# Patient Record
Sex: Male | Born: 1960 | Race: White | Hispanic: No | Marital: Married | Smoking: Current every day smoker
Health system: Southern US, Community
[De-identification: ages and names within clinical notes are randomized; demographics above are authoritative.]

## PROBLEM LIST (undated history)

## (undated) DIAGNOSIS — F419 Anxiety disorder, unspecified: Secondary | ICD-10-CM

## (undated) DIAGNOSIS — M545 Low back pain, unspecified: Secondary | ICD-10-CM

## (undated) DIAGNOSIS — L409 Psoriasis, unspecified: Secondary | ICD-10-CM

## (undated) DIAGNOSIS — K625 Hemorrhage of anus and rectum: Secondary | ICD-10-CM

## (undated) DIAGNOSIS — K649 Unspecified hemorrhoids: Secondary | ICD-10-CM

## (undated) HISTORY — DX: Low back pain, unspecified: M54.50

## (undated) HISTORY — DX: Low back pain: M54.5

## (undated) HISTORY — DX: Unspecified hemorrhoids: K64.9

## (undated) HISTORY — DX: Hemorrhage of anus and rectum: K62.5

## (undated) HISTORY — DX: Anxiety disorder, unspecified: F41.9

---

## 1994-03-26 HISTORY — PX: VASECTOMY: SHX75

## 2003-03-27 HISTORY — PX: KNEE ARTHROSCOPY: SUR90

## 2004-11-24 ENCOUNTER — Ambulatory Visit: Payer: Self-pay | Admitting: Orthopedic Surgery

## 2004-11-30 ENCOUNTER — Ambulatory Visit: Payer: Self-pay | Admitting: Orthopedic Surgery

## 2006-03-26 HISTORY — PX: KNEE ARTHROSCOPY: SUR90

## 2010-08-27 ENCOUNTER — Ambulatory Visit: Payer: Self-pay | Admitting: Family Medicine

## 2011-07-14 ENCOUNTER — Ambulatory Visit: Payer: Self-pay | Admitting: Physical Medicine and Rehabilitation

## 2011-11-23 DIAGNOSIS — R079 Chest pain, unspecified: Secondary | ICD-10-CM | POA: Insufficient documentation

## 2011-11-23 DIAGNOSIS — K649 Unspecified hemorrhoids: Secondary | ICD-10-CM | POA: Insufficient documentation

## 2011-11-23 DIAGNOSIS — K625 Hemorrhage of anus and rectum: Secondary | ICD-10-CM | POA: Insufficient documentation

## 2012-04-13 ENCOUNTER — Ambulatory Visit: Payer: Self-pay | Admitting: Internal Medicine

## 2014-01-06 IMAGING — CT CT ABD-PELV W/ CM
1 of 2 series · 15 of 32 positions shown, 19 images · IV contrast (isovue)
Comparison: none

REASON FOR EXAM: RLQ pain
COMMENTS:

PROCEDURE:     CT  - CT ABDOMEN / PELVIS  W  - April 13, 2012  [DATE]
RESULT:     Comparison:  None
TECHNIQUE: Multiple axial images of the abdomen and pelvis were performed
from the lung bases to the pubic symphysis, with p.o. contrast and with 100
mL of Isovue 300 intravenous contrast.

[Series 2: 3mm soft tissue · axial · 0.68mm/px · z∈[-1001,-584]mm · 15 of 153 slices shown, 19 images]
[im 7/153  soft-tissue]
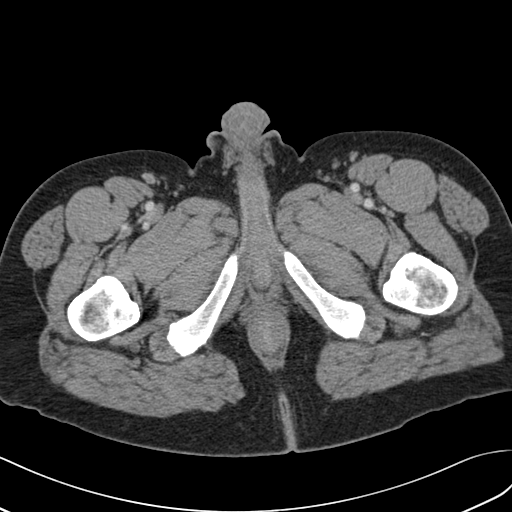
[im 7/153  bone]
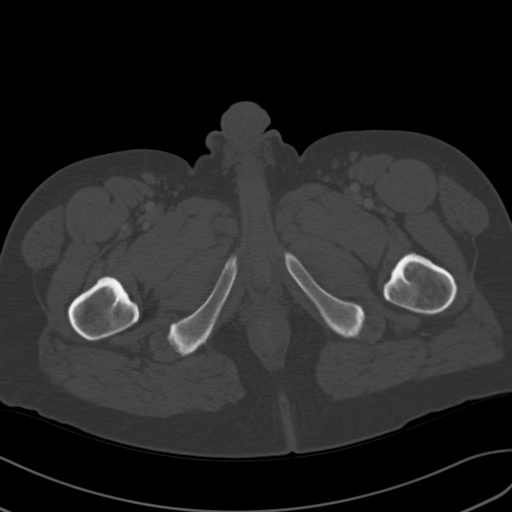
[im 20/153  soft-tissue]
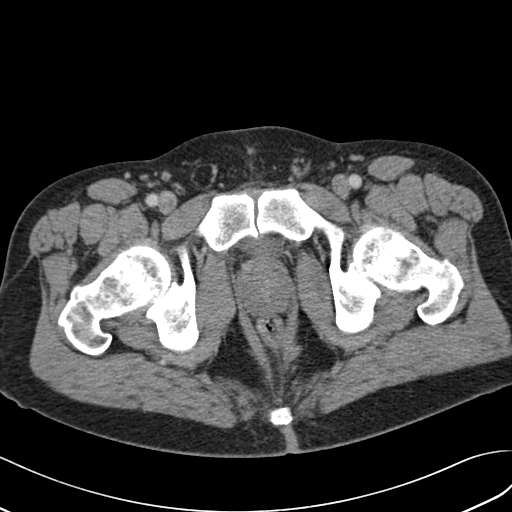
[im 32/153  soft-tissue]
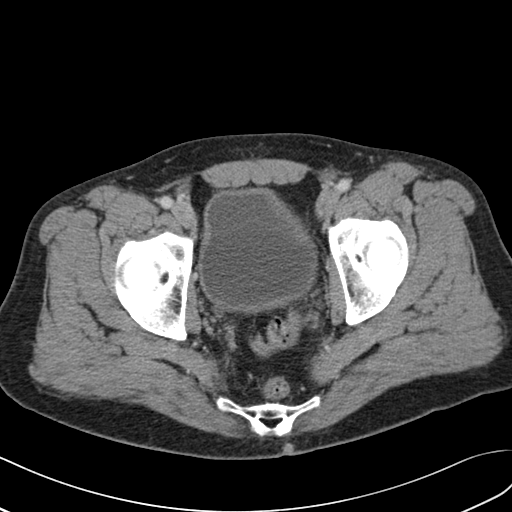
[im 45/153  soft-tissue]
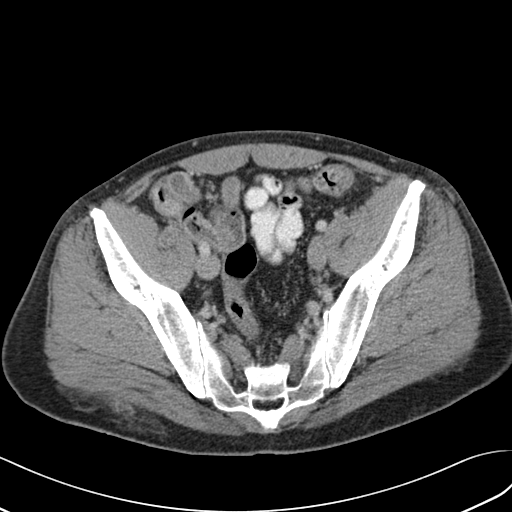
[im 51/153  soft-tissue]
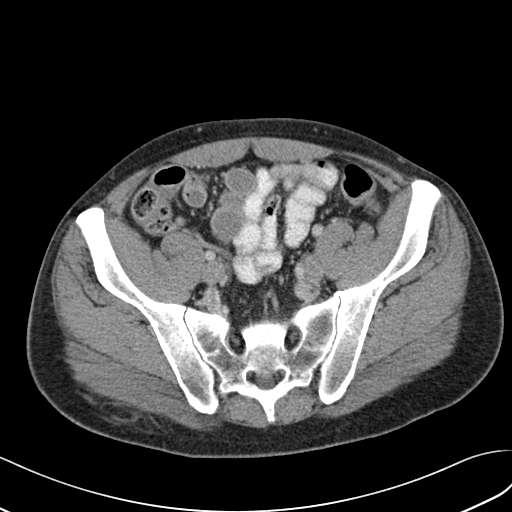
[im 64/153  soft-tissue]
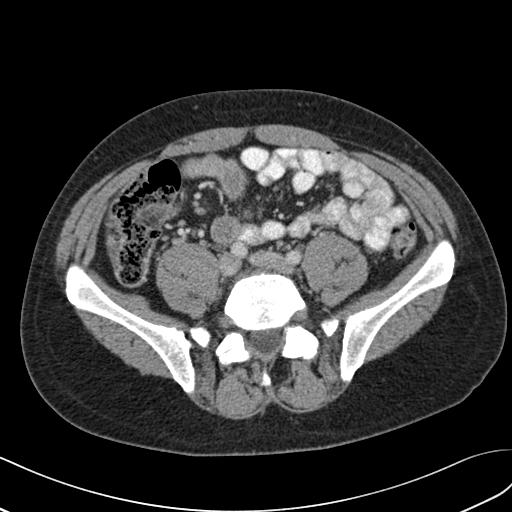
[im 77/153  soft-tissue]
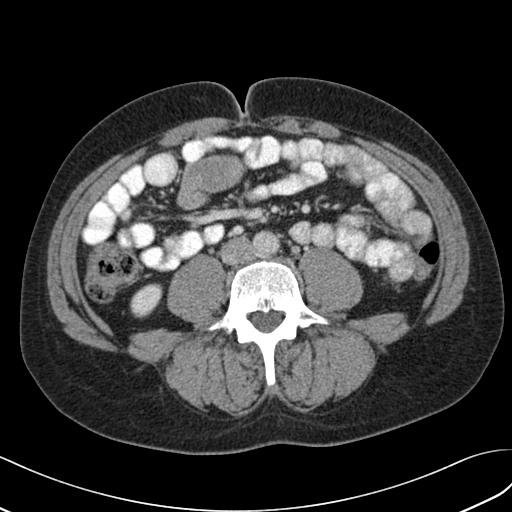
[im 89/153  soft-tissue]
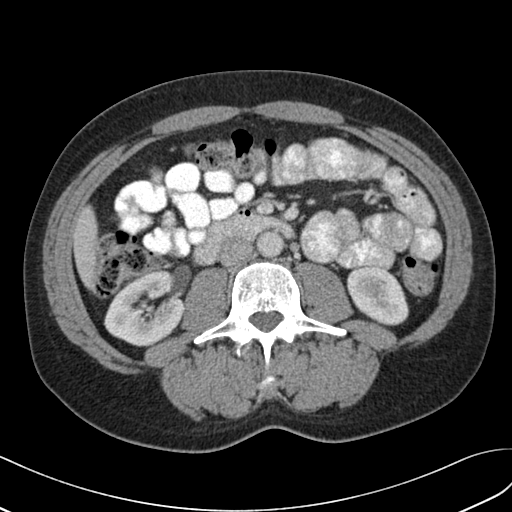
[im 102/153  soft-tissue]
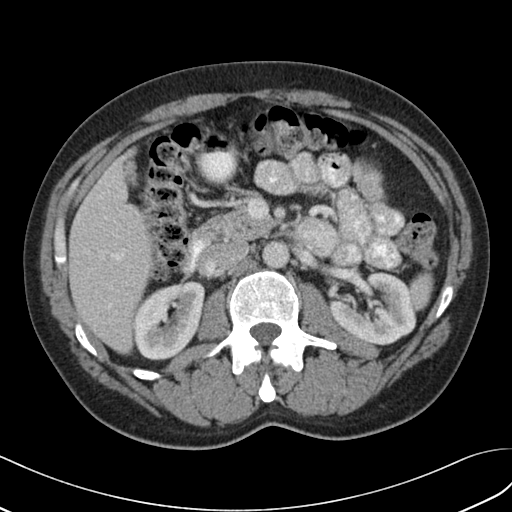
[im 102/153  bone]
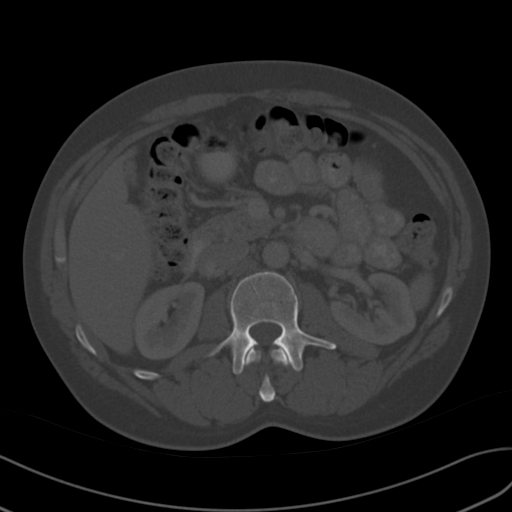
[im 108/153  soft-tissue]
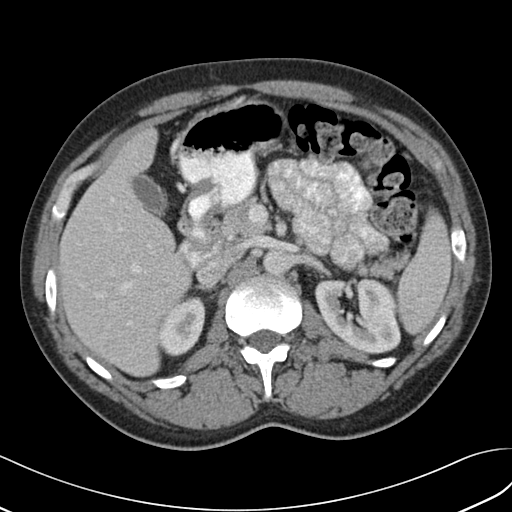
[im 121/153  soft-tissue]
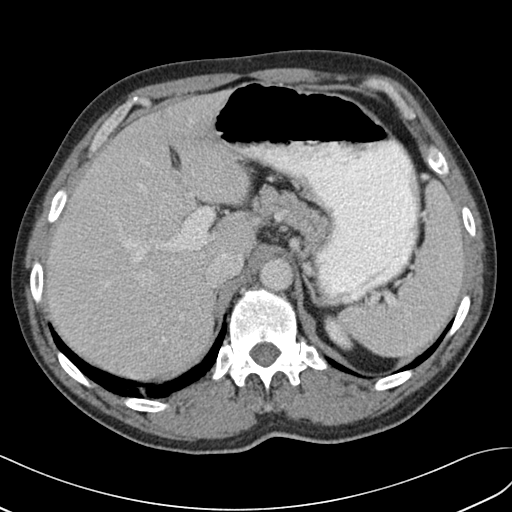
[im 127/153  lung]
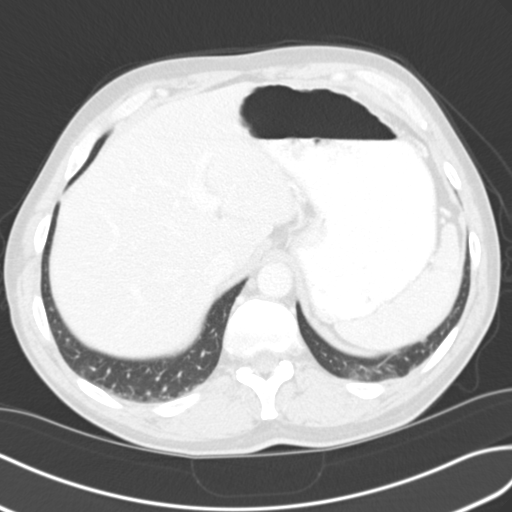
[im 134/153  soft-tissue]
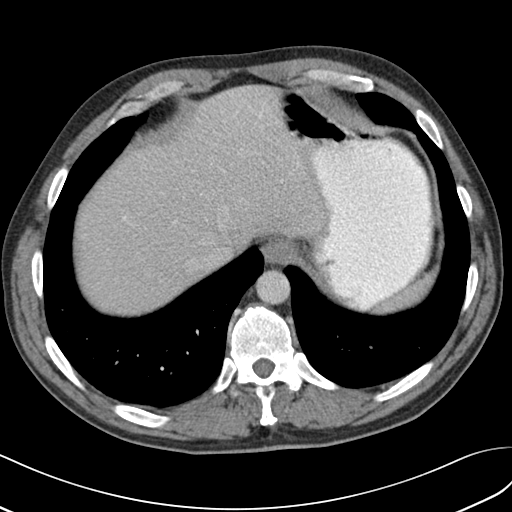
[im 134/153  lung]
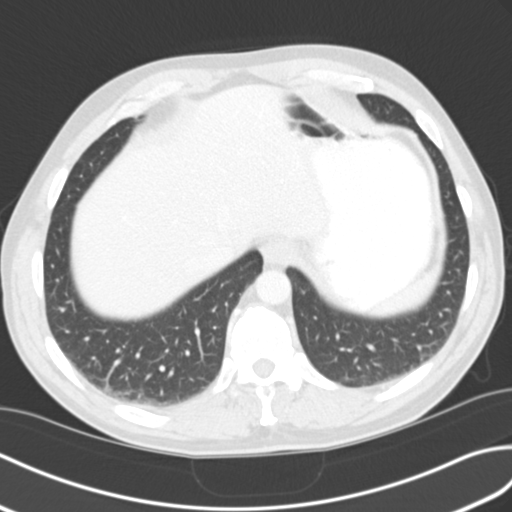
[im 140/153  lung]
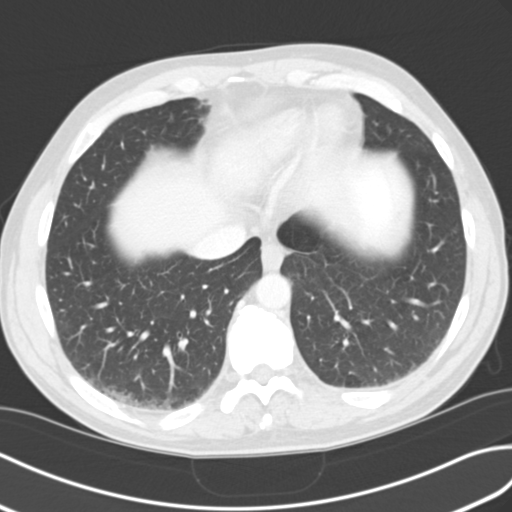
[im 146/153  soft-tissue]
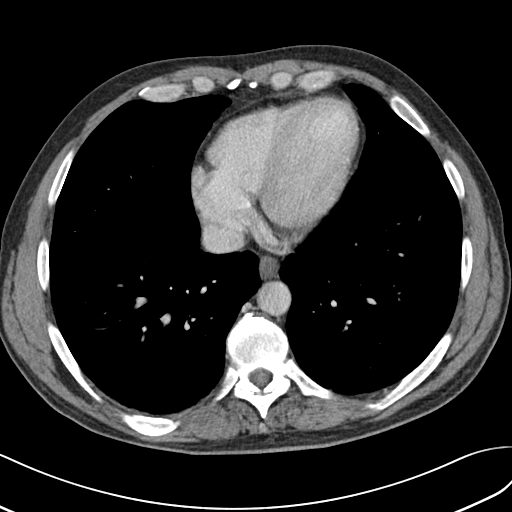
[im 146/153  lung]
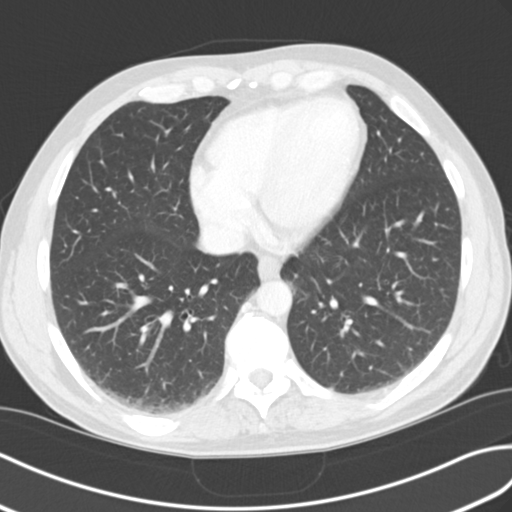

[15 of 32 positions shown; findings below may reference images not displayed]

FINDINGS: Mild basilar opacities are likely secondary to atelectasis.

The liver, gallbladder, spleen, adrenals, and pancreas are unremarkable. The
kidneys enhance normally.

The small and large bowel are normal in caliber. The appendix is normal.

There is mild stranding and a few small foci of air in the subcutaneous fat
of the buttock. Correlate for recent prior injection.

No aggressive lytic or sclerotic osseous lesions are identified.
IMPRESSION: 1. No acute findings in the abdomen or pelvis. Normal appendix.
2. Mild stranding and small foci of air in the subcutaneous fat of the right
buttock. Correlate for recent prior injection.

The report was called to the ordering clinician immediately after the
dictation.

[REDACTED]

## 2014-08-09 ENCOUNTER — Ambulatory Visit
Admission: EM | Admit: 2014-08-09 | Discharge: 2014-08-09 | Disposition: A | Discharge status: Home / Self Care | Payer: No Typology Code available for payment source | Attending: Family Medicine | Admitting: Family Medicine

## 2014-08-09 DIAGNOSIS — H6123 Impacted cerumen, bilateral: Secondary | ICD-10-CM | POA: Diagnosis not present

## 2014-08-09 HISTORY — DX: Psoriasis, unspecified: L40.9

## 2014-08-09 MED ORDER — CARBAMIDE PEROXIDE 6.5 % OT SOLN: OTIC | Status: DC

## 2014-08-09 NOTE — ED Provider Notes (Signed)
CSN: [id number]     Arrival date & time 08/09/14  1129 History   First MD Initiated Contact with Patient 08/09/14 1206     Chief Complaint  Patient presents with  . Otalgia   (Consider location/radiation/quality/duration/timing/severity/associated sxs/prior Treatment) Patient is a 54 y.o. male presenting with ear pain. The history is provided by the patient. No language interpreter was used.  Otalgia Location:  Right Quality:  Pressure Severity:  Moderate Onset quality:  Gradual Duration:  1 week Timing:  Constant Progression:  Worsening Chronicity:  Recurrent Context: not direct blow, not elevation change, not foreign body in ear, not loud noise and no water in ear   Relieved by:  Nothing Associated symptoms: hearing loss   Associated symptoms: no abdominal pain, no congestion, no diarrhea, no ear discharge, no fever, no headaches, no rash, no rhinorrhea, no sore throat and no tinnitus   Risk factors: no recent travel, no chronic ear infection and no prior ear surgery     Past Medical History  Diagnosis Date  . Psoriasis    History reviewed. No pertinent past surgical history. Family History  Problem Relation Age of Onset  . Heart failure Mother   . Cancer Father    History  Substance Use Topics  . Smoking status: Current Every Day Smoker -- 2.00 packs/day    Types: Cigarettes  . Smokeless tobacco: Not on file  . Alcohol Use: Yes     Comment: rarely    Review of Systems  Constitutional: Negative for fever.  HENT: Positive for ear pain and hearing loss. Negative for congestion, ear discharge, rhinorrhea, sore throat and tinnitus.        While he is here because of pain in the right ear he has a history of having wax buildup in both ears.  Gastrointestinal: Negative for abdominal pain and diarrhea.  Skin: Negative for rash.  Neurological: Negative for headaches.    Allergies  Review of patient's allergies indicates no known allergies.  Home Medications   Prior  to Admission medications   Medication Sig Start Date End Date Taking? Authorizing Provider  carbamide peroxide (DEBROX) 6.5 % otic solution Recommend he place a Doppler to both ears twice a day until return for repeat irrigation. 08/09/14   Hassan RowanEugene Divon Krabill, MD   BP 126/77 mmHg  Pulse 72  Temp(Src) 97 F (36.1 C) (Tympanic)  Resp 16  Ht 5\' 11"  (1.803 m)  Wt 200 lb (90.719 kg)  BMI 27.91 kg/m2  SpO2 99% Physical Exam  Constitutional: He appears well-developed and well-nourished.  HENT:  Head: Normocephalic and atraumatic.  Right Ear: Decreased hearing is noted.  Left Ear: Decreased hearing is noted.  Ears:  Musculoskeletal: Normal range of motion.  Neurological: He is alert.  Skin: Skin is warm and dry.  Vitals reviewed.   ED Course  EAR CERUMEN REMOVAL Date/Time: 08/09/2014 12:25 PM Performed by: Hassan RowanWADE, Hamdan Toscano Authorized by: Hassan RowanWADE, Deontra Pereyra Consent: Verbal consent obtained. Patient understanding: patient states understanding of the procedure being performed Patient consent: the patient's understanding of the procedure matches consent given Local anesthetic: none Location: Both ears had excess amount of wax present. Patient tolerance: Patient tolerated the procedure well with no immediate complications (Attempts were made initially with curette unsuccessfully to remove the wax plans are to irrigate both ears now)   (including critical care time) Labs Review Labs Reviewed - No data to display  Imaging Review No results found. Patient left ear still had significant amount cerumen left right ear was  almost completely clear but the ear canal was markedly hyperemic at this time. No further irrigations this time I have patient return in a week if he so desire after placing Debrox in both ears.  MDM   1. Cerumen impaction, bilateral        Hassan RowanEugene Kaizley Aja, MD 08/09/14 2053

## 2014-08-09 NOTE — Discharge Instructions (Signed)
Cerumen Impaction °A cerumen impaction is when the wax in your ear forms a plug. This plug usually causes reduced hearing. Sometimes it also causes an earache or dizziness. Removing a cerumen impaction can be difficult and painful. The wax sticks to the ear canal. The canal is sensitive and bleeds easily. If you try to remove a heavy wax buildup with a cotton tipped swab, you may push it in further. °Irrigation with water, suction, and small ear curettes may be used to clear out the wax. If the impaction is fixed to the skin in the ear canal, ear drops may be needed for a few days to loosen the wax. People who build up a lot of wax frequently can use ear wax removal products available in your local drugstore. °SEEK MEDICAL CARE IF:  °You develop an earache, increased hearing loss, or marked dizziness. °Document Released: 04/19/2004 Document Revised: 06/04/2011 Document Reviewed: 06/09/2009 °ExitCare® Patient Information ©2015 [employer]. This information is not intended to replace advice given to you by your health care provider. Make sure you discuss any questions you have with your health care provider. ° °Ear Drops °You have been diagnosed with a condition requiring you to put drops of medicine into your outer ear. °HOME CARE INSTRUCTIONS  °· Put drops in the affected ear as instructed. After putting the drops in, you will need to lie down with the affected ear facing up for ten minutes so the drops will remain in the ear canal and run down and fill the canal. Continue using the ear drops for as long as directed by your health care provider. °· Prior to getting up, put a cotton ball gently in your ear canal. Leave enough of the cotton ball out so it can be easily removed. Do not attempt to push this down into the canal with a cotton-tipped swab or other instrument. °· Do not irrigate or wash out your ears if you have had a perforated eardrum or mastoid surgery, or unless instructed to do so by your health care  provider. °· Keep appointments with your health care provider as instructed. °· Finish all medicine, or use for the length of time prescribed by your health care provider. Continue the drops even if your problem seems to be doing well after a couple days, or continue as instructed. °SEEK MEDICAL CARE IF: °· You become worse or develop increasing pain. °· You notice any unusual drainage from your ear (particularly if the drainage has a bad smell). °· You develop hearing difficulties. °· You experience a serious form of dizziness in which you feel as if the room is spinning, and you feel nauseated (vertigo). °· The outside of your ear becomes red or swollen or both. This may be a sign of an allergic reaction. °MAKE SURE YOU:  °· Understand these instructions. °· Will watch your condition. °· Will get help right away if you are not doing well or get worse. °Document Released: 03/06/2001 Document Revised: 03/17/2013 Document Reviewed: 10/07/2012 °ExitCare® Patient Information ©2015 [employer]. This information is not intended to replace advice given to you by your health care provider. Make sure you discuss any questions you have with your health care provider. ° °

## 2014-08-09 NOTE — ED Notes (Signed)
States noted to have ear wax impacted right ear. States flushed ear with large amount of wax. Now "very sore"

## 2014-08-13 ENCOUNTER — Ambulatory Visit
Admission: EM | Admit: 2014-08-13 | Discharge: 2014-08-13 | Disposition: A | Discharge status: Home / Self Care | Payer: No Typology Code available for payment source | Attending: Registered Nurse | Admitting: Registered Nurse

## 2014-08-13 DIAGNOSIS — L03116 Cellulitis of left lower limb: Secondary | ICD-10-CM

## 2014-08-13 MED ORDER — SULFAMETHOXAZOLE-TRIMETHOPRIM 800-160 MG PO TABS: 1.0000 | ORAL_TABLET | Freq: Two times a day (BID) | ORAL | Status: AC

## 2014-08-13 NOTE — ED Notes (Signed)
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 Wearing long pants and did not see what bit or stung. 4-5 hours later noted black area/bite and squeezed a small amount of pus. Now very sore and read with slight bullseye appearance of the area just above left knee

## 2014-08-13 NOTE — Discharge Instructions (Signed)
Insect Bite Mosquitoes, flies, fleas, bedbugs, and many other insects can bite. Insect bites are different from insect stings. A sting is when venom is injected into the skin. Some insect bites can transmit infectious diseases. SYMPTOMS  Insect bites usually turn red, swell, and itch for 2 to 4 days. They often go away on their own. TREATMENT  Your caregiver may prescribe antibiotic medicines if a bacterial infection develops in the bite. HOME CARE INSTRUCTIONS  Do not scratch the bite area.  Keep the bite area clean and dry. Wash the bite area thoroughly with soap and water.  Put ice or cool compresses on the bite area.  Put ice in a plastic bag.  Place a towel between your skin and the bag.  Leave the ice on for 20 minutes, 4 times a day for the first 2 to 3 days, or as directed.  You may apply a baking soda paste, cortisone cream, or calamine lotion to the bite area as directed by your caregiver. This can help reduce itching and swelling.  Only take over-the-counter or prescription medicines as directed by your caregiver.  If you are given antibiotics, take them as directed. Finish them even if you start to feel better. You may need a tetanus shot if:  You cannot remember when you had your last tetanus shot.  You have never had a tetanus shot.  The injury broke your skin. If you get a tetanus shot, your arm may swell, get red, and feel warm to the touch. This is common and not a problem. If you need a tetanus shot and you choose not to have one, there is a rare chance of getting tetanus. Sickness from tetanus can be serious. SEEK IMMEDIATE MEDICAL CARE IF:   You have increased pain, redness, or swelling in the bite area.  You see a red line on the skin coming from the bite.  You have a fever.  You have joint pain.  You have a headache or neck pain.  You have unusual weakness.  You have a rash.  You have chest pain or shortness of breath.  You have abdominal pain,  nausea, or vomiting.  You feel unusually tired or sleepy. MAKE SURE YOU:   Understand these instructions.  Will watch your condition.  Will get help right away if you are not doing well or get worse. Document Released: 04/19/2004 Document Revised: 06/04/2011 Document Reviewed: 10/11/2010 ExitCare Patient Information 2015 [employer]. This information is not intended to replace advice given to you by your health care provider. Make sure you discuss any questions you have with your health care provider. Cellulitis Cellulitis is an infection of the skin and the tissue beneath it. The infected area is usually red and tender. Cellulitis occurs most often in the arms and lower legs.  CAUSES  Cellulitis is caused by bacteria that enter the skin through cracks or cuts in the skin. The most common types of bacteria that cause cellulitis are staphylococci and streptococci. SIGNS AND SYMPTOMS   Redness and warmth.  Swelling.  Tenderness or pain.  Fever. DIAGNOSIS  Your health care provider can usually determine what is wrong based on a physical exam. Blood tests may also be done. TREATMENT  Treatment usually involves taking an antibiotic medicine. HOME CARE INSTRUCTIONS   Take your antibiotic medicine as directed by your health care provider. Finish the antibiotic even if you start to feel better.  Keep the infected arm or leg elevated to reduce swelling.  Apply   a warm cloth to the affected area up to 4 times per day to relieve pain.  Take medicines only as directed by your health care provider.  Keep all follow-up visits as directed by your health care provider. SEEK MEDICAL CARE IF:   You notice red streaks coming from the infected area.  Your red area gets larger or turns dark in color.  Your bone or joint underneath the infected area becomes painful after the skin has healed.  Your infection returns in the same area or another area.  You notice a swollen bump in the  infected area.  You develop new symptoms.  You have a fever. SEEK IMMEDIATE MEDICAL CARE IF:   You feel very sleepy.  You develop vomiting or diarrhea.  You have a general ill feeling (malaise) with muscle aches and pains. MAKE SURE YOU:   Understand these instructions.  Will watch your condition.  Will get help right away if you are not doing well or get worse. Document Released: 12/20/2004 Document Revised: 07/27/2013 Document Reviewed: 05/28/2011 ExitCare Patient Information 2015 [employer]. This information is not intended to replace advice given to you by your health care provider. Make sure you discuss any questions you have with your health care provider.  

## 2014-08-13 NOTE — ED Provider Notes (Signed)
CSN: [id number]     Arrival date & time 08/13/14  [id number] History   None    Chief Complaint  Patient presents with  . 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    Past Medical History  Diagnosis Date  . Psoriasis    History reviewed. No pertinent past surgical history. Family History  Problem Relation Age of Onset  . Heart failure Mother   . Cancer Father    History  Substance Use Topics  . Smoking status: Current Every Day Smoker -- 2.00 packs/day    Types: Cigarettes  . Smokeless tobacco: Not on file  . Alcohol Use: Yes     Comment: rarely    Review of Systems  Constitutional: Negative for fever and chills.  HENT: Negative for ear discharge and ear pain.   Eyes: Negative for pain, discharge and itching.  Respiratory: Negative for cough, shortness of breath and wheezing.   Cardiovascular: Positive for leg swelling. Negative for chest pain and palpitations.  Gastrointestinal: Negative for nausea, vomiting, diarrhea and constipation.  Genitourinary: Negative for difficulty urinating.  Musculoskeletal: Positive for myalgias and joint swelling. Negative for back pain, arthralgias, gait problem, neck pain and neck stiffness.  Skin: Positive for rash. Negative for color change, pallor and wound.  Allergic/Immunologic: Negative for environmental allergies and food allergies.  Neurological: Negative for dizziness, light-headedness and headaches.  Hematological: Negative for adenopathy. Does not bruise/bleed easily.  Psychiatric/Behavioral: Positive for sleep disturbance. Negative for behavioral problems and agitation.    Allergies  Review of patient's allergies indicates no known  allergies.  Home Medications   Prior to Admission medications   Medication Sig Start Date End Date Taking? Authorizing Provider  carbamide peroxide (DEBROX) 6.5 % otic solution Recommend he place a Doppler to both ears twice a day until return for repeat irrigation. 08/09/14   Hassan RowanEugene Wade, MD  sulfamethoxazole-trimethoprim (BACTRIM DS,SEPTRA DS) 800-160 MG per tablet Take 1 tablet by mouth 2 (two) times daily. 08/13/14 08/20/14  Jarold Songina A Betancourt, NP   BP 129/78 mmHg  Pulse 78  Temp(Src) 97.1 F (36.2 C) (Tympanic)  Resp 16  Ht 5\' 11"  (1.803 m)  Wt 200 lb (90.719 kg)  BMI 27.91 kg/m2  SpO2 98% Physical Exam  Constitutional: He is oriented to person, place, and time. Vital signs are normal. He appears well-developed and well-nourished. No distress.  HENT:  Head: Normocephalic and atraumatic.  Right Ear: External ear normal.  Left Ear: External ear normal.  Nose: Nose normal.  Mouth/Throat: Oropharynx is clear and moist. No oropharyngeal exudate.  Eyes: Conjunctivae, EOM and lids are normal. Pupils are equal, round, and reactive to light. Right eye exhibits no discharge. Left eye exhibits no discharge. No scleral icterus.  Neck: Trachea normal and normal range of motion. Neck supple. No tracheal deviation present.  Cardiovascular: Normal rate, regular rhythm and intact distal pulses.   Pulmonary/Chest: Effort normal and breath sounds normal. No respiratory distress. He has no wheezes.  Musculoskeletal: Normal range of motion. He exhibits edema and tenderness.       Left knee: He exhibits swelling and erythema. He exhibits normal range of motion, no effusion, no ecchymosis, no deformity, no laceration, normal alignment, no LCL laxity, normal patellar mobility, no bony tenderness and normal meniscus. Tenderness found. No  medial joint line and no lateral joint line tenderness noted.  Lymphadenopathy:    He has no cervical adenopathy.  Neurological: He is alert and oriented to person, place,  and time.  Skin: Skin is warm, dry and intact. Rash noted. No abrasion, no bruising, no burn, no ecchymosis, no laceration, no lesion, no petechiae and no purpura noted. Rash is macular. Rash is not papular, not maculopapular, not nodular, not pustular, not vesicular and not urticarial. He is not diaphoretic. There is erythema. No pallor.     Psychiatric: He has a normal mood and affect. His speech is normal and behavior is normal. Judgment and thought content normal. Cognition and memory are normal.    ED Course  Procedures (including critical care time) Labs Review Labs Reviewed - No data to display  Imaging Review No results found.   MDM   1. Cellulitis of left lower extremity   Probable bug bite initiating offendor cannot rule out puncture from foliage; does not appear to be any foreign bodies in rash Plan: 1. diagnosis reviewed with patient 2. rx as per orders; risks, benefits, potential side effects reviewed with patient 3. Recommend supportive treatment with tylenol, bactroban, ice, rest, elevation 4. F/u prn if symptoms worsen or don't improve Will treat for cellulitis possible bug bite to left knee.  Exitcare handout on skin infection given to patient.  RTC if worsening erythema, pain, purulent discharge, fever.  Patient and spouse verbalized understanding, agreed with plan of care and had no further questions at this time.      Barbaraann Barthelina A Betancourt, NP 08/13/14 1435

## 2014-08-17 ENCOUNTER — Encounter: Payer: Self-pay | Admitting: Family Medicine

## 2014-08-17 ENCOUNTER — Ambulatory Visit
Admission: EM | Admit: 2014-08-17 | Discharge: 2014-08-17 | Disposition: A | Discharge status: Home / Self Care | Payer: No Typology Code available for payment source | Attending: Family Medicine | Admitting: Family Medicine

## 2014-08-17 DIAGNOSIS — L02416 Cutaneous abscess of left lower limb: Secondary | ICD-10-CM | POA: Diagnosis not present

## 2014-08-17 MED ORDER — MUPIROCIN 2 % EX OINT: 1.0000 "application " | TOPICAL_OINTMENT | Freq: Three times a day (TID) | CUTANEOUS | Status: DC

## 2014-08-17 NOTE — ED Notes (Signed)
Patient is here today for a recheck on insect bite. His left knee is red and seeping a clear serum. He states the area is throbbing and stinging. He is having a hard time walking.

## 2014-08-17 NOTE — ED Provider Notes (Addendum)
CSN: [id number]     Arrival date & time 08/17/14  0757 History   First MD Initiated Contact with Patient 08/17/14 0818     Chief Complaint  Patient presents with  . Insect Bite    recheck- 1 week   (Consider location/radiation/quality/duration/timing/severity/associated sxs/prior Treatment) HPI   A 54 year old gentleman who returns today following me and seen here for an insect bite for which she was placed on Septra. He states that the pain continues and last night he had tried to squeeze the abscess with mostly blood returned. Examination on his left anterior knee superior to the patella shows a central area of purulence and is able to express a moderate amount of purulence. There is deep induration as explained below. He is still on Septra and has approximately 7 days left according to his wife.  Past Medical History  Diagnosis Date  . Psoriasis    History reviewed. No pertinent past surgical history. Family History  Problem Relation Age of Onset  . Heart failure Mother   . Cancer Father    History  Substance Use Topics  . Smoking status: Current Every Day Smoker -- 2.00 packs/day    Types: Cigarettes  . Smokeless tobacco: Not on file  . Alcohol Use: Yes     Comment: rarely    Review of Systems  Constitutional: Negative for fever and chills.  Skin: Positive for wound.  All other systems reviewed and are negative.   Allergies  Review of patient's allergies indicates no known allergies.  Home Medications   Prior to Admission medications   Medication Sig Start Date End Date Taking? Authorizing Provider  carbamide peroxide (DEBROX) 6.5 % otic solution Recommend he place a Doppler to both ears twice a day until return for repeat irrigation. 08/09/14   Hassan Rowan, MD  mupirocin ointment (BACTROBAN) 2 % Apply 1 application topically 3 (three) times daily. 08/17/14   Lutricia Feil, PA-C  sulfamethoxazole-trimethoprim (BACTRIM DS,SEPTRA DS) 800-160 MG per tablet Take 1  tablet by mouth 2 (two) times daily. 08/13/14 08/20/14  Jarold Song Betancourt, NP   BP 110/69 mmHg  Pulse 76  Temp(Src) 97.6 F (36.4 C) (Oral)  Resp 16  Ht  (1.803 m)  Wt 200 lb (90.719 kg)  BMI 27.91 kg/m2  SpO2 99% Physical Exam  Constitutional: He is oriented to person, place, and time. He appears well-developed and well-nourished.  HENT:  Head: Normocephalic and atraumatic.  Eyes: EOM are normal. Pupils are equal, round, and reactive to light.  Neurological: He is alert and oriented to person, place, and time. He has normal reflexes.  Skin:  Examination of the left knee in the suprapatellar area and distal left anterior thigh shows a 2.5 cm diameter abscess with a central punctate area that is oozing purulence. After patient consent the skin was cleansed with Hibiclens solution. There was draped in appropriate fashion. 1% lidocaine was utilized to infiltrate the area using 5 mL without epinephrine. An 11 blade was utilized to open the area and a mosquito forcep taken to the depths of the wound and bluntly opening loculations with production of additional purulence. The wound was then packed with continuous gauze about 5 inches. This is then covered with a dry sterile dressing. No complications were encountered  Psychiatric: He has a normal mood and affect. His behavior is normal. Judgment and thought content normal.  Nursing note and vitals reviewed.   ED Course  Procedures (including critical care time) Labs Review Labs Reviewed -  No data to display  Imaging Review No results found.   MDM   1. Cutaneous abscess of left lower extremity    New Prescriptions   MUPIROCIN OINTMENT (BACTROBAN) 2 %    Apply 1 application topically 3 (three) times daily.  Plan: 1.  diagnosis reviewed with patient 2. rx as per orders; risks, benefits, potential side effects reviewed with patient 3. Recommend supportive treatment with continued Septra and bactroban. If wick falls out start QID  warm compresses explained to patient and wife. Need F/U here in 2-3 days or sooner if any changes. 4. F/u prn if symptoms worsen or don't improve     Lutricia FeilWilliam P Rekia Kujala, PA-C 08/17/14 0900  Lutricia FeilWilliam P Mukesh Kornegay, PA-C 08/25/14 1450

## 2014-08-17 NOTE — Discharge Instructions (Signed)

## 2014-08-20 ENCOUNTER — Ambulatory Visit
Admission: EM | Admit: 2014-08-20 | Discharge: 2014-08-20 | Disposition: A | Discharge status: Home / Self Care | Payer: No Typology Code available for payment source | Attending: Family Medicine | Admitting: Family Medicine

## 2014-08-20 ENCOUNTER — Encounter: Payer: Self-pay | Admitting: Emergency Medicine

## 2014-08-20 DIAGNOSIS — L02416 Cutaneous abscess of left lower limb: Secondary | ICD-10-CM

## 2014-08-20 NOTE — Discharge Instructions (Signed)

## 2014-08-20 NOTE — ED Notes (Signed)
Patient here for wound check to his L knee and possible repacking.  Patient denies fevers.  Patient continues to have drainage from the site.

## 2014-08-20 NOTE — ED Provider Notes (Signed)
CSN: [id number]     Arrival date & time 08/20/14  [id number] History   First MD Initiated Contact with Patient 08/20/14 1134     Chief Complaint  Patient presents with  . Wound Check   (Consider location/radiation/quality/duration/timing/severity/associated sxs/prior Treatment) Patient is a 54 y.o. male presenting with wound check. The history is provided by the patient.  Wound Check This is a chronic problem.   Chief Complaint  Patient presents with  . Insect Bite    recheck- 1 week   (Consider location/radiation/quality/duration/timing/severity/associated sxs/prior Treatment) HPI Comments: Caucasian male has been working all week out of town; covering affected area with saran wrap to shower. Has not removed packing but changing top bandaid and overlying gauze daily and prn saturation. Was seen by PA Roemer 17 Aug 2014 and had I&D performed as abscess had developed at bite area since I last evaluated him 13 Aug 2014. Patient reported pain and redness has resolved but still having purulent drainage. Applying bactroban and still taking septra DS.   The history is provided by the patient.    Past Medical History  Diagnosis Date  . Psoriasis   . Psoriasis    History reviewed. No pertinent past surgical history. Family History  Problem Relation Age of Onset  . Heart failure Mother   . Cancer Father    History  Substance Use Topics  . Smoking status: Current Every Day Smoker -- 2.00 packs/day    Types: Cigarettes  . Smokeless tobacco: Never Used  . Alcohol Use: Yes     Comment: rarely    Review of Systems  Constitutional: Negative for fever, chills, diaphoresis, activity change, appetite change, fatigue and unexpected weight change.  HENT: Negative for facial swelling, mouth sores, postnasal drip and rhinorrhea.  Respiratory: Negative for cough, shortness of breath and wheezing.  Cardiovascular: Negative for chest pain,  palpitations and leg swelling.  Gastrointestinal: Negative for nausea, vomiting and diarrhea.  Genitourinary: Negative for difficulty urinating.  Musculoskeletal: Negative for myalgias, back pain, joint swelling, arthralgias, gait problem, neck pain and neck stiffness.  Skin: Positive for wound. Negative for color change, pallor and rash.  Neurological: Negative for tremors, weakness and headaches.  Hematological: Negative for adenopathy. Does not bruise/bleed easily.  Psychiatric/Behavioral: Negative for sleep disturbance.    Allergies  Review of patient's allergies indicates no known allergies.  Home Medications   Prior to Admission medications   Medication Sig Start Date End Date Taking? Authorizing Provider  carbamide peroxide (DEBROX) 6.5 % otic solution Recommend he place a Doppler to both ears twice a day until return for repeat irrigation. 08/09/14   Hassan RowanEugene Wade, MD  etanercept (ENBREL) 50 MG/ML injection Inject 50 mg into the skin once a week.    Historical Provider, MD  mupirocin ointment (BACTROBAN) 2 % Apply 1 application topically 3 (three) times daily. 08/17/14   Lutricia FeilWilliam P Roemer, PA-C  sulfamethoxazole-trimethoprim (BACTRIM DS,SEPTRA DS) 800-160 MG per tablet Take 1 tablet by mouth 2 (two) times daily. 08/13/14 08/20/14  Jarold Songina A Marcelino Campos, NP   BP 110/69 mmHg  Pulse 76  Temp(Src) 97.6 F (36.4 C) (Oral)  Resp 16  Ht 5\' 11"  (1.803 m)  Wt 200 lb (90.719 kg)  BMI 27.91 kg/m2  SpO2 99% Physical Exam  Constitutional: He is oriented to person, place, and time. Vital signs are normal. He appears well-developed and well-nourished. No distress.  HENT:  Head: Normocephalic and atraumatic.  Right Ear: External ear normal.  Left Ear: External ear normal.  Nose:  Nose normal.  Mouth/Throat: Oropharynx is clear and moist.  Eyes: Conjunctivae, EOM and lids are normal. Pupils are equal, round, and reactive to light.  Neck: Trachea normal and  normal range of motion. Neck supple.  Cardiovascular: Normal rate, regular rhythm and intact distal pulses.  Pulmonary/Chest: Effort normal and breath sounds normal. He has no wheezes.  Abdominal: Soft.  Musculoskeletal: Normal range of motion. He exhibits tenderness. He exhibits no edema.   Left knee: Normal. He exhibits normal range of motion, no swelling, no effusion, no ecchymosis, no deformity, no laceration, no erythema, normal alignment and no bony tenderness. No tenderness found. No medial joint line and no lateral joint line tenderness noted.  Neurological: He is alert and oriented to person, place, and time.  Skin: Skin is warm. Rash noted. No abrasion, no bruising, no burn, no ecchymosis, no laceration, no lesion, no petechiae and no purpura noted. Rash is pustular. Rash is not macular, not papular, not maculopapular, not nodular, not vesicular and not urticarial. He is not diaphoretic. There is erythema. No pallor.     Psychiatric: He has a normal mood and affect. His speech is normal and behavior is normal. Judgment and thought content normal. Cognition and memory are normal.  Nursing note and vitals reviewed.              Past Medical History  Diagnosis Date  . Psoriasis   . Psoriasis    History reviewed. No pertinent past surgical history. Family History  Problem Relation Age of Onset  . Heart failure Mother   . Cancer Father    History  Substance Use Topics  . Smoking status: Current Every Day Smoker -- 2.00 packs/day    Types: Cigarettes  . Smokeless tobacco: Never Used  . Alcohol Use: Yes     Comment: rarely    Review of Systems  Allergies  Review of patient's allergies indicates no known allergies.  Home Medications   Prior to Admission medications   Medication Sig Start Date End Date Taking? Authorizing Provider  etanercept (ENBREL) 50 MG/ML injection Inject 50 mg into the skin once a week.   Yes Historical Provider, MD  carbamide  peroxide (DEBROX) 6.5 % otic solution Recommend he place a Doppler to both ears twice a day until return for repeat irrigation. 08/09/14   Hassan Rowan, MD  mupirocin ointment (BACTROBAN) 2 % Apply 1 application topically 3 (three) times daily. 08/17/14   Lutricia Feil, PA-C  sulfamethoxazole-trimethoprim (BACTRIM DS,SEPTRA DS) 800-160 MG per tablet Take 1 tablet by mouth 2 (two) times daily. 08/13/14 08/20/14  Jarold Song [place] Doster, NP   BP 109/64 mmHg  Pulse 64  Temp(Src) 96.6 F (35.9 C) (Tympanic)  Resp 16  Ht  (1.803 m)  Wt 200 lb (90.719 kg)  BMI 27.91 kg/m2  SpO2 100% Physical Exam  ED Course  Procedures (including critical care time) Labs Review Labs Reviewed - No data to display  Imaging Review No results found.   MDM   1. Abscess of left leg    Patient last seen by me 13 Aug 2014 and saw PA Phillis Knack 17 Aug 2014 and I&D completed with packing. Erythema and pain resolved but induration around I&D area continues 1.5cm. Exitcare handout on leg abcess given to patient. Discussed to continue daily dressing changes and prn if saturated. Continue bactroban topical application daily and finish all po septra as ordered. Improving but still having purulent discharge/repacked today, cleansed with hibiclens. Discussed with patient not to  soak in tub/pool/hot tub but may shower daily. Keep packing in place until follow up visit. Patient to follow up 29 May for re-evaluation and packing removal/redressing. Discussed with patient full healing/regranulation may take 2-4 weeks and avoid soaking area, keep covered and petrolatum based ointment until healed e.g. Bactroban, neosporin, vaseline. RTC if worsening erythema, pain, purulent discharge, fever. Patient verbalized understanding, agreed with plan of care and had no further questions at this time.     Barbaraann Barthel, NP 08/20/14 1649   Barbaraann Barthel, NP 08/20/14 1750

## 2014-08-20 NOTE — ED Provider Notes (Deleted)
CSN: [id number]     Arrival date & time 08/17/14  0757 History   First MD Initiated Contact with Patient 08/17/14 0818     Chief Complaint  Patient presents with  . Insect Bite    recheck- 1 week   (Consider location/radiation/quality/duration/timing/severity/associated sxs/prior Treatment) HPI Comments: Caucasian male has been working all week out of town; covering affected area with saran wrap to shower.  Has not removed packing but changing top bandaid and overlying gauze daily and prn saturation.  Was seen by PA Roemer 17 Aug 2014 and had I&D performed as abscess had developed at bite area since I last evaluated him 13 Aug 2014. Patient reported pain and redness has resolved but still having purulent drainage.  Applying bactroban and still taking septra DS.    The history is provided by the patient.    Past Medical History  Diagnosis Date  . Psoriasis   . Psoriasis    History reviewed. No pertinent past surgical history. Family History  Problem Relation Age of Onset  . Heart failure Mother   . Cancer Father    History  Substance Use Topics  . Smoking status: Current Every Day Smoker -- 2.00 packs/day    Types: Cigarettes  . Smokeless tobacco: Never Used  . Alcohol Use: Yes     Comment: rarely    Review of Systems  Constitutional: Negative for fever, chills, diaphoresis, activity change, appetite change, fatigue and unexpected weight change.  HENT: Negative for facial swelling, mouth sores, postnasal drip and rhinorrhea.   Respiratory: Negative for cough, shortness of breath and wheezing.   Cardiovascular: Negative for chest pain, palpitations and leg swelling.  Gastrointestinal: Negative for nausea, vomiting and diarrhea.  Genitourinary: Negative for difficulty urinating.  Musculoskeletal: Negative for myalgias, back pain, joint swelling, arthralgias, gait problem, neck pain and neck stiffness.  Skin: Positive for wound. Negative for color change, pallor and rash.    Neurological: Negative for tremors, weakness and headaches.  Hematological: Negative for adenopathy. Does not bruise/bleed easily.  Psychiatric/Behavioral: Negative for sleep disturbance.    Allergies  Review of patient's allergies indicates no known allergies.  Home Medications   Prior to Admission medications   Medication Sig Start Date End Date Taking? Authorizing Provider  carbamide peroxide (DEBROX) 6.5 % otic solution Recommend he place a Doppler to both ears twice a day until return for repeat irrigation. 08/09/14   Hassan RowanEugene Wade, MD  etanercept (ENBREL) 50 MG/ML injection Inject 50 mg into the skin once a week.    Historical Provider, MD  mupirocin ointment (BACTROBAN) 2 % Apply 1 application topically 3 (three) times daily. 08/17/14   Lutricia FeilWilliam P Roemer, PA-C  sulfamethoxazole-trimethoprim (BACTRIM DS,SEPTRA DS) 800-160 MG per tablet Take 1 tablet by mouth 2 (two) times daily. 08/13/14 08/20/14  Jarold Songina A Betancourt, NP   BP 110/69 mmHg  Pulse 76  Temp(Src) 97.6 F (36.4 C) (Oral)  Resp 16  Ht 5\' 11"  (1.803 m)  Wt 200 lb (90.719 kg)  BMI 27.91 kg/m2  SpO2 99% Physical Exam  Constitutional: He is oriented to person, place, and time. Vital signs are normal. He appears well-developed and well-nourished. No distress.  HENT:  Head: Normocephalic and atraumatic.  Right Ear: External ear normal.  Left Ear: External ear normal.  Nose: Nose normal.  Mouth/Throat: Oropharynx is clear and moist.  Eyes: Conjunctivae, EOM and lids are normal. Pupils are equal, round, and reactive to light.  Neck: Trachea normal and normal range of motion. Neck supple.  Cardiovascular: Normal rate, regular rhythm and intact distal pulses.   Pulmonary/Chest: Effort normal and breath sounds normal. He has no wheezes.  Abdominal: Soft.  Musculoskeletal: Normal range of motion. He exhibits tenderness. He exhibits no edema.       Left knee: Normal. He exhibits normal range of motion, no swelling, no effusion, no  ecchymosis, no deformity, no laceration, no erythema, normal alignment and no bony tenderness. No tenderness found. No medial joint line and no lateral joint line tenderness noted.  Neurological: He is alert and oriented to person, place, and time.  Skin: Skin is warm. Rash noted. No abrasion, no bruising, no burn, no ecchymosis, no laceration, no lesion, no petechiae and no purpura noted. Rash is pustular. Rash is not macular, not papular, not maculopapular, not nodular, not vesicular and not urticarial. He is not diaphoretic. There is erythema. No pallor.     Psychiatric: He has a normal mood and affect. His speech is normal and behavior is normal. Judgment and thought content normal. Cognition and memory are normal.  Nursing note and vitals reviewed.   ED Course  Procedures (including critical care time) Labs Review Labs Reviewed - No data to display  Imaging Review No results found.   MDM   1. Cutaneous abscess of left lower extremity    Patient last seen by me 13 Aug 2014 and saw PA Phillis Knack 17 Aug 2014 and I&D completed with packing.  Erythema and pain resolved but induration around I&D area continues 1.5cm.  Exitcare handout on leg abcess given to patient. Discussed to continue daily dressing changes and prn if saturated.  Continue bactroban topical application daily and finish all po septra as ordered.  Improving but still having purulent discharge/repacked today, cleansed with hibiclens.  Discussed with patient not to soak in tub/pool/hot tub but may shower daily.  Keep packing in place until follow up visit.  Patient to follow up 29 May for re-evaluation and packing removal/redressing.  Discussed with patient full healing/regranulation may take 2-4 weeks and avoid soaking area, keep covered and petrolatum based ointment until healed e.g. Bactroban, neosporin, vaseline.   RTC if worsening erythema, pain, purulent discharge, fever.  Patient verbalized understanding, agreed with plan of  care and had no further questions at this time.      Barbaraann Barthel, NP 08/20/14 1649

## 2014-08-22 ENCOUNTER — Encounter: Payer: Self-pay | Admitting: *Deleted

## 2014-08-22 ENCOUNTER — Ambulatory Visit
Admission: EM | Admit: 2014-08-22 | Discharge: 2014-08-22 | Disposition: A | Discharge status: Home / Self Care | Payer: No Typology Code available for payment source | Attending: Family Medicine | Admitting: Family Medicine

## 2014-08-22 DIAGNOSIS — L02416 Cutaneous abscess of left lower limb: Secondary | ICD-10-CM

## 2014-08-22 DIAGNOSIS — Z4801 Encounter for change or removal of surgical wound dressing: Secondary | ICD-10-CM

## 2014-08-22 MED ORDER — SULFAMETHOXAZOLE-TRIMETHOPRIM 800-160 MG PO TABS: 1.0000 | ORAL_TABLET | Freq: Two times a day (BID) | ORAL | Status: AC

## 2014-08-22 NOTE — ED Notes (Signed)
Today for 4th wound recheck on wound on left upper left leg near his knee cap.

## 2014-08-22 NOTE — ED Provider Notes (Signed)
CSN: [id number]     Arrival date & time 08/22/14  1216 History   First MD Initiated Contact with Patient 08/22/14 1413     Chief Complaint  Patient presents with  . Wound Check   (Consider location/radiation/quality/duration/timing/severity/associated sxs/prior Treatment) HPI  Follow up visit 54 yo M with insect bite  presumed initiated  abcess left ant thigh above knee . Has been seen for initial I&D and subsequent wick changes. He feels it is getting better, but not completely well yet. Has one day of atb Rx left Septra. Changes dressing daily  Past Medical History  Diagnosis Date  . Psoriasis   . Psoriasis    History reviewed. No pertinent past surgical history. Family History  Problem Relation Age of Onset  . Heart failure Mother   . Cancer Father    History  Substance Use Topics  . Smoking status: Current Every Day Smoker -- 2.00 packs/day    Types: Cigarettes  . Smokeless tobacco: Never Used  . Alcohol Use: Yes     Comment: rarely    Review of Systems Constitutional -afebrile Eyes-denies visual changes ENT- normal voice,denies sore throat CV-denies chest pain Resp-denies SOB GI- negative for nausea,vomiting, diarrhea GU- negative for dysuria MSK- negative for back pain, ambulatory Skin- denies acute changes Neuro- negative headache,focal weakness or numbness Skin abcess with induration, resolving left leg  Allergies  Review of patient's allergies indicates no known allergies.  Home Medications   Prior to Admission medications   Medication Sig Start Date End Date Taking? Authorizing Provider  carbamide peroxide (DEBROX) 6.5 % otic solution Recommend he place a Doppler to both ears twice a day until return for repeat irrigation. 08/09/14   Hassan RowanEugene Wade, MD  etanercept (ENBREL) 50 MG/ML injection Inject 50 mg into the skin once a week.    Historical Provider, MD  mupirocin ointment (BACTROBAN) 2 % Apply 1 application topically 3 (three) times daily. 08/17/14    Lutricia FeilWilliam P Roemer, PA-C  sulfamethoxazole-trimethoprim (BACTRIM DS,SEPTRA DS) 800-160 MG per tablet Take 1 tablet by mouth 2 (two) times daily. 08/22/14 08/28/14  Rae HalstedLaurie W Talon Regala, PA-C   BP 116/74 mmHg  Pulse 75  Temp(Src) 98.3 F (36.8 C) (Oral)  Resp 18  Ht 6' (1.829 m)  Wt 200 lb (90.719 kg)  BMI 27.12 kg/m2  SpO2 100% Physical Exam  Well appearing ambulatory Male in NAD- Afebrile, VSS.-presents with dressing on left thigh. Circumferential induration improved by his report. Purulent drainage small amount continues from wick. Induration present  Peripheral,mod firm Constitutional -alert and oriented,well appearing and in no acute distress Head-atraumatic Eyes- conjugate gaze Nose- no congestion or rhinorrhea Mouth/throat- mucous membranes moist Neck- supple CV- regular rate,  Resp-no distress, MSK-  See HPI .Marland Kitchen.Right LE.no lower extremity tenderness nor edema,no joint effusion, ambulatory Neuro- normal speech and language Psych-;speech and behavior grossly normal Remainder of presentation WNL   ED Course  Procedures (including critical care time) Labs Review Labs Reviewed - No data to display  Imaging Review No results found.   Dressing removed and wick removed from I& D site- Cleansed with betadine/saline/ Qtip probe- Tender. Single strand 1/4 " plain gauze wick replaced in external orifice to maintain for a few more days. 3-4 cm halo of induration present. 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 Concerned because still tender and atb almost finished  MDM   1. Abscess of left thigh     Taught patient wound  care. Maintain wick as long as possible -change dressing daily. Dilute peroxide and Qtip cleansing of canal daily if wick falls out. Explained mechanism of wick and wound healing. Ok to experience minor bleeding spots with Qtip cleaning as new tissue develops. Allow to close slowly as long as induration steadily  improves. Will renew 5 days of antibiotic coverage. Recommend using heating pad across lap during TV time to encourage drainage and wound resolution. RTC with any exacerbation of current status or should he develop increased prurlent drainage, increased induration, pain or fever. Diagnosis and treatment discussed. . Questions fielded, expectations and recommendations reviewed. Patient expresses understanding. Will return to [facility] with questions, concern or exacerbation.     Rae Halsted, PA-C 08/23/14 2330

## 2015-07-16 ENCOUNTER — Ambulatory Visit
Admission: EM | Admit: 2015-07-16 | Discharge: 2015-07-16 | Disposition: A | Discharge status: Home / Self Care | Payer: 59 | Attending: Family Medicine | Admitting: Family Medicine

## 2015-07-16 ENCOUNTER — Encounter: Payer: Self-pay | Admitting: Gynecology

## 2015-07-16 DIAGNOSIS — L03119 Cellulitis of unspecified part of limb: Secondary | ICD-10-CM

## 2015-07-16 MED ORDER — IBUPROFEN 800 MG PO TABS: 800.0000 mg | ORAL_TABLET | Freq: Three times a day (TID) | ORAL | Status: AC

## 2015-07-16 MED ORDER — SULFAMETHOXAZOLE-TRIMETHOPRIM 800-160 MG PO TABS: 1.0000 | ORAL_TABLET | Freq: Two times a day (BID) | ORAL | Status: DC

## 2015-07-16 NOTE — Discharge Instructions (Signed)
Cellulitis Cellulitis is an infection of the skin and the tissue beneath it. The infected area is usually red and tender. Cellulitis occurs most often in the arms and lower legs.  CAUSES  Cellulitis is caused by bacteria that enter the skin through cracks or cuts in the skin. The most common types of bacteria that cause cellulitis are staphylococci and streptococci. SIGNS AND SYMPTOMS   Redness and warmth.  Swelling.  Tenderness or pain.  Fever. DIAGNOSIS  Your health care provider can usually determine what is wrong based on a physical exam. Blood tests may also be done. TREATMENT  Treatment usually involves taking an antibiotic medicine. HOME CARE INSTRUCTIONS   Take your antibiotic medicine as directed by your health care provider. Finish the antibiotic even if you start to feel better.  Keep the infected arm or leg elevated to reduce swelling.  Apply a warm cloth to the affected area up to 4 times per day to relieve pain.  Take medicines only as directed by your health care provider.  Keep all follow-up visits as directed by your health care provider. SEEK MEDICAL CARE IF:   You notice red streaks coming from the infected area.  Your red area gets larger or turns dark in color.  Your bone or joint underneath the infected area becomes painful after the skin has healed.  Your infection returns in the same area or another area.  You notice a swollen bump in the infected area.  You develop new symptoms.  You have a fever. SEEK IMMEDIATE MEDICAL CARE IF:   You feel very sleepy.  You develop vomiting or diarrhea.  You have a general ill feeling (malaise) with muscle aches and pains.   This information is not intended to replace advice given to you by your health care provider. Make sure you discuss any questions you have with your health care provider.   Document Released: 12/20/2004 Document Revised: 12/01/2014 Document Reviewed: 05/28/2011 Elsevier Interactive  Patient Education 2016 [employer] Carpal Tunnel Syndrome Carpal tunnel syndrome is a condition that causes pain in your hand and arm. The carpal tunnel is a narrow area located on the palm side of your wrist. Repeated wrist motion or certain diseases may cause swelling within the tunnel. This swelling pinches the main nerve in the wrist (median nerve). CAUSES  This condition may be caused by:   Repeated wrist motions.  Wrist injuries.  Arthritis.  A cyst or tumor in the carpal tunnel.  Fluid buildup during pregnancy. Sometimes the cause of this condition is not known.  RISK FACTORS This condition is more likely to develop in:   People who have jobs that cause them to repeatedly move their wrists in the same motion, such as butchers and cashiers.  Women.  People with certain conditions, such as:  Diabetes.  Obesity.  An underactive thyroid (hypothyroidism).  Kidney failure. SYMPTOMS  Symptoms of this condition include:   A tingling feeling in your fingers, especially in your thumb, index, and middle fingers.  Tingling or numbness in your hand.  An aching feeling in your entire arm, especially when your wrist and elbow are bent for long periods of time.  Wrist pain that goes up your arm to your shoulder.  Pain that goes down into your palm or fingers.  A weak feeling in your hands. You may have trouble grabbing and holding items. Your symptoms may feel worse during the night.  DIAGNOSIS  This condition is diagnosed with a medical history and  physical exam. You may also have tests, including:   An electromyogram (EMG). This test measures electrical signals sent by your nerves into the muscles.  X-rays. TREATMENT  Treatment for this condition includes:  Lifestyle changes. It is important to stop doing or modify the activity that caused your condition.  Physical or occupational therapy.  Medicines for pain and inflammation. This may include medicine that is  injected into your wrist.  A wrist splint.  Surgery. HOME CARE INSTRUCTIONS  If You Have a Splint:  Wear it as told by your health care provider. Remove it only as told by your health care provider.  Loosen the splint if your fingers become numb and tingle, or if they turn cold and blue.  Keep the splint clean and dry. General Instructions  Take over-the-counter and prescription medicines only as told by your health care provider.  Rest your wrist from any activity that may be causing your pain. If your condition is work related, talk to your employer about changes that can be made, such as getting a wrist pad to use while typing.  If directed, apply ice to the painful area:  Put ice in a plastic bag.  Place a towel between your skin and the bag.  Leave the ice on for 20 minutes, 2-3 times per day.  Keep all follow-up visits as told by your health care provider. This is important.  Do any exercises as told by your health care provider, physical therapist, or occupational therapist. SEEK MEDICAL CARE IF:   You have new symptoms.  Your pain is not controlled with medicines.  Your symptoms get worse.   This information is not intended to replace advice given to you by your health care provider. Make sure you discuss any questions you have with your health care provider.   Document Released: 03/09/2000 Document Revised: 12/01/2014 Document Reviewed: 07/28/2014 Elsevier Interactive Patient Education [employer]

## 2015-07-16 NOTE — ED Provider Notes (Signed)
CSN: [id number]     Arrival date & time 07/16/15  1519 History   First MD Initiated Contact with Patient 07/16/15 1624     Chief Complaint  Patient presents with  . 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  Pain right wrist woke him up this am.  Took motrin  po not helping.  At noon today rash developed right wrist.  Feels weak right hand/unable to make fist.  Denied trauma, insect bite, chemical splash.  Denied itchiness but patient rubbing affected area right anterior wrist frequently.  Pinched right index finger distal healing bruise earlier this month.  Right hand dominant.  The history is provided by the patient and the spouse.    Past Medical History  Diagnosis Date  . Psoriasis   . Psoriasis    History reviewed. No pertinent past surgical history. Family History  Problem Relation Age of Onset  . Heart failure Mother   . Cancer Father    Social History  Substance Use Topics  . Smoking status: Current Every Day Smoker -- 2.00 packs/day    Types: Cigarettes  . Smokeless tobacco: Never Used  . Alcohol Use: Yes     Comment: rarely    Review of Systems  Constitutional: Negative for fever, chills, activity change and appetite change.  HENT: Negative for congestion, ear pain and sore throat.   Eyes: Negative for pain and discharge.  Respiratory: Negative for cough and wheezing.   Cardiovascular: Negative for chest pain and leg swelling.  Gastrointestinal: Negative for nausea, vomiting, diarrhea, constipation and blood in stool.  Endocrine: Negative for cold intolerance and heat intolerance.  Genitourinary: Negative for dysuria, hematuria and difficulty urinating.  Musculoskeletal: Positive for myalgias and joint swelling. Negative for back pain, arthralgias, gait problem, neck pain and neck  stiffness.  Skin: Positive for color change and rash. Negative for pallor and wound.  Allergic/Immunologic: Negative for environmental allergies and food allergies.  Neurological: Positive for weakness. Negative for dizziness, tremors, seizures, syncope, facial asymmetry, speech difficulty, light-headedness, numbness and headaches.  Hematological: Negative for adenopathy. Does not bruise/bleed easily.  Psychiatric/Behavioral: Positive for sleep disturbance. Negative for behavioral problems, confusion and agitation. The patient is not nervous/anxious.     Allergies  Review of patient's allergies indicates no known allergies.  Home Medications   Prior to Admission medications   Medication Sig Start Date End Date Taking? Authorizing Provider  Secukinumab (COSENTYX SENSOREADY PEN) 150 MG/ML SOAJ Inject into the skin.   Yes Historical Provider, MD  ibuprofen (ADVIL,MOTRIN) 800 MG tablet Take 1 tablet (800 mg total) by mouth 3 (three) times daily. 07/16/15   Barbaraann Barthel, NP  sulfamethoxazole-trimethoprim (BACTRIM DS,SEPTRA DS) 800-160 MG tablet Take 1 tablet by mouth 2 (two) times daily. 07/16/15   Barbaraann Barthel, NP   Meds Ordered and Administered this Visit  Medications - No data to display  BP 118/72 mmHg  Pulse 78  Temp(Src) 98.6 F (37 C) (Oral)  Resp 16  Ht 6' (1.829 m)  Wt 200 lb (90.719 kg)  BMI 27.12 kg/m2  SpO2 100% No data found.   Physical Exam  Constitutional: He is oriented to person, place, and time. Vital signs are normal. He appears well-developed and well-nourished. He is cooperative.  Non-toxic appearance. He does not have a sickly appearance. He does not appear ill. No distress.  HENT:  Head: Normocephalic and atraumatic.  Right  Ear: Hearing, external ear and ear canal normal.  Left Ear: Hearing, external ear and ear canal normal.  Nose: Nose normal.  Mouth/Throat: Uvula is midline, oropharynx is clear and moist and mucous membranes are normal. No  oropharyngeal exudate.  Eyes: Conjunctivae, EOM and lids are normal. Pupils are equal, round, and reactive to light. Right eye exhibits no chemosis, no discharge, no exudate and no hordeolum. No foreign body present in the right eye. Left eye exhibits no chemosis, no discharge, no exudate and no hordeolum. No foreign body present in the left eye. No scleral icterus. Pupils are equal.  Neck: Trachea normal and normal range of motion. Neck supple. No tracheal tenderness present. No rigidity. No tracheal deviation, no edema, no erythema and normal range of motion present. No thyromegaly present.  Cardiovascular: Normal rate, regular rhythm, normal heart sounds and intact distal pulses.  Exam reveals no gallop and no friction rub.   No murmur heard. Pulses:      Radial pulses are 2+ on the right side, and 2+ on the left side.  Capillary refill less than 2 seconds bilateral fingers  Pulmonary/Chest: Effort normal and breath sounds normal. No stridor. No respiratory distress. He has no wheezes. He has no rhonchi. He has no rales. He exhibits no tenderness.  Abdominal: Soft.  Musculoskeletal: He exhibits edema and tenderness.       Right shoulder: Normal.       Left shoulder: Normal.       Right elbow: Normal.      Left elbow: Normal.       Right wrist: He exhibits decreased range of motion, tenderness, swelling and effusion. He exhibits no bony tenderness, no crepitus, no deformity and no laceration.       Left wrist: Normal.       Left forearm: Normal.       Arms:      Right hand: He exhibits decreased range of motion. He exhibits no tenderness, no bony tenderness, normal two-point discrimination, normal capillary refill, no deformity, no laceration and no swelling. Normal sensation noted. Decreased strength noted. He exhibits finger abduction, thumb/finger opposition and wrist extension trouble.       Left hand: Normal.       Hands: Lymphadenopathy:       Head (right side): No submental, no  submandibular, no tonsillar, no preauricular, no posterior auricular and no occipital adenopathy present.       Head (left side): No submental, no submandibular, no tonsillar, no preauricular, no posterior auricular and no occipital adenopathy present.    He has no cervical adenopathy.  Neurological: He is alert and oriented to person, place, and time. He is not disoriented. He displays no atrophy and no tremor. No cranial nerve deficit or sensory deficit. He exhibits normal muscle tone. He displays no seizure activity. Coordination and gait normal. GCS eye subscore is 4. GCS verbal subscore is 5. GCS motor subscore is 6.  Skin: Skin is warm, dry and intact. Bruising and rash noted. No abrasion, no burn, no ecchymosis, no laceration, no lesion, no petechiae and no purpura noted. Rash is macular. Rash is not papular, not maculopapular, not nodular, not pustular, not vesicular and not urticarial. He is not diaphoretic. There is erythema. No cyanosis. No pallor. Nails show no clubbing.     Psychiatric: He has a normal mood and affect. His speech is normal and behavior is normal. Judgment and thought content normal. Cognition and memory are normal.  Nursing note and  vitals reviewed.   ED Course  Procedures (including critical care time)  Labs Review Labs Reviewed - No data to display  Imaging Review No results found.   MDM   1. Cellulitis of wrist    Will treat for cellulitis right wrist possible bug bite.  Exitcare handout on skin infection and carpal tunnel syndrome given to patient.  ER tonight if hand/fingers blue, cold, unable to move right fingers, erythema extends to elbow or into palm of hand.  Discussed signs/symptoms of blood clot with patient and spouse.   Follow up for re-evaluation if worsening erythema, pain, purulent discharge, fever after 48 hours on bactrim DS po BID x 7 days. Continue motrin 800mg  po TID prn pain. Wash towels, washcloths, sheets in hot water with bleach every  couple of days until infection resolved. Cryotherapy 15 minutes QID.  Elevate right arm today and tomorrow.  Do not scratch/itch affected area. Patient and spouse verbalized understanding, agreed with plan of care and had no further questions at this time.      Barbaraann Barthelina A Betancourt, NP 07/16/15 1655

## 2015-07-16 NOTE — ED Notes (Signed)
Patient c/o tingling / burning sensation in right arm / right wrist x yesterday. Per patient woke up this morning with a spot /swelling on right wrist. Patient stated unable to make a fist with his right hand.

## 2015-07-21 ENCOUNTER — Telehealth: Payer: Self-pay | Admitting: Family Medicine

## 2015-07-21 NOTE — Telephone Encounter (Signed)
Spouse reported patient symptoms improved (swelling resolved and some fingers no longer numb but 2 still are) patient at work today still taking Bactrim DS.  Discussed with spouse internal swelling could still be present and due to history of infections requiring longer courses of antibiotics and on cosentyx if patient improving would consider extending his bactrim DS for another week.  Patient to contact clinic and ask to speak with a nurse if he desires extended antibiotic course.  Pharmacy [facility] [address], [place], [place]  Spouse verbalized understanding information/instructions and had no further questions at this time.

## 2018-01-15 ENCOUNTER — Ambulatory Visit: Payer: 59 | Admitting: Family Medicine

## 2018-01-15 ENCOUNTER — Encounter: Payer: Self-pay | Admitting: Family Medicine

## 2018-01-15 VITALS — BP 134/81 | HR 82 | Ht 71.0 in | Wt 198.0 lb

## 2018-01-15 DIAGNOSIS — F5104 Psychophysiologic insomnia: Secondary | ICD-10-CM

## 2018-01-15 DIAGNOSIS — F419 Anxiety disorder, unspecified: Secondary | ICD-10-CM | POA: Diagnosis not present

## 2018-01-15 DIAGNOSIS — Z7689 Persons encountering health services in other specified circumstances: Secondary | ICD-10-CM

## 2018-01-15 DIAGNOSIS — F17210 Nicotine dependence, cigarettes, uncomplicated: Secondary | ICD-10-CM | POA: Diagnosis not present

## 2018-01-15 DIAGNOSIS — M5416 Radiculopathy, lumbar region: Secondary | ICD-10-CM

## 2018-01-15 DIAGNOSIS — L409 Psoriasis, unspecified: Secondary | ICD-10-CM | POA: Diagnosis not present

## 2018-01-15 DIAGNOSIS — G8929 Other chronic pain: Secondary | ICD-10-CM | POA: Insufficient documentation

## 2018-01-15 MED ORDER — BUSPIRONE HCL 10 MG PO TABS: 10.0000 mg | ORAL_TABLET | Freq: Two times a day (BID) | ORAL | 2 refills | Status: DC

## 2018-01-15 MED ORDER — HYDROXYZINE HCL 25 MG PO TABS: 25.0000 mg | ORAL_TABLET | Freq: Three times a day (TID) | ORAL | 1 refills | Status: DC | PRN

## 2018-01-15 MED ORDER — QUETIAPINE FUMARATE 50 MG PO TABS: 50.0000 mg | ORAL_TABLET | Freq: Every day | ORAL | 1 refills | Status: DC

## 2018-01-15 NOTE — Progress Notes (Signed)
BP 134/81   Pulse 82   Ht 5\' 11"  (1.803 m)   Wt 198 lb (89.8 kg)   SpO2 99%   BMI 27.62 kg/m    Subjective:    Patient ID: Austin Huynh, male    DOB: May 19, 1960, 57 y.o.   MRN: [id number]  HPI: Austin Huynh is a 57 y.o. male  Chief Complaint  Patient presents with  . New Patient (Initial Visit)  . Anxiety    Shaking, fear, sex drive declined x 6 months.    Patient here today to establish care. PMHx significant for psoriasis, currently on cosentyx managed by Dermatology, chronic low back pain, and hemorrhoids.  Main concern today is anxiety that is new to him over the past year or two. Has gone through a lot in his personal life with job changes, moving, and running a business and states he's changed tremendously from all of it. Over this time he notes low energy, indecision, low libido, tremors. Has tried zoloft, remeron, celexa, effexor etc with no major benefit. Along with this new anxiety has come severe insomnia. Patient and wife both state he will sleep until about 2 am every night regardless of bedtime and habits. Has tried melatonin with no benefit. Denies SI/HI, significant mood swings.    Smokes cigarettes to help cope with stress. Not interested in quitting right now.   Relevant past medical, surgical, family and social history reviewed and updated as indicated. Interim medical history since our last visit reviewed. Allergies and medications reviewed and updated.  Review of Systems  Per HPI unless specifically indicated above     Objective:    BP 134/81   Pulse 82   Ht 5\' 11"  (1.803 m)   Wt 198 lb (89.8 kg)   SpO2 99%   BMI 27.62 kg/m   Wt Readings from Last 3 Encounters:  01/15/18 198 lb (89.8 kg)  07/16/15 200 lb (90.7 kg)  08/22/14 200 lb (90.7 kg)    Physical Exam  Constitutional: He is oriented to person, place, and time. He appears well-developed and well-nourished.  HENT:  Head: Atraumatic.  Eyes: Conjunctivae and EOM are normal.  Neck:  Normal range of motion. Neck supple.  Pulmonary/Chest: Effort normal and breath sounds normal.  Musculoskeletal: Normal range of motion.  Neurological: He is alert and oriented to person, place, and time.  Skin: Skin is warm and dry.  Psychiatric: Judgment and thought content normal.  Very anxious  Nursing note and vitals reviewed.   No results found for this or any previous visit.    Assessment & Plan:   Problem List Items Addressed This Visit      Musculoskeletal and Integument   Psoriasis    Managed by Dermatology, currently stable on cosentyx.         Other   Anxiety    Long discussion today about management options. Pt leery to start more medicine today as he's not seen major benefit during prior attempts. Agreeable to trying buspar and prn hydroxyzine. Counseling packet given with strong recommendation to establish with someone.       Relevant Medications   busPIRone (BUSPAR) 10 MG tablet   hydrOXYzine (ATARAX/VISTARIL) 25 MG tablet   Cigarette smoker    Not interested in quitting. Risks and resources discussed      Insomnia    Currently presenting a major quality of life issue for him. Will start 50 mg seroquel to help with both sleep and moods/anxiety. Titrate up as  tolerated. Sedation risks reviewed       Other Visit Diagnoses    Encounter to establish care    -  Primary       Follow up plan: Return in about 4 weeks (around 02/12/2018) for CPE, mood f/u.

## 2018-01-18 DIAGNOSIS — G4700 Insomnia, unspecified: Secondary | ICD-10-CM | POA: Insufficient documentation

## 2018-01-18 NOTE — Patient Instructions (Signed)
Follow up in 1 month   

## 2018-01-18 NOTE — Assessment & Plan Note (Signed)
Not interested in quitting. Risks and resources discussed

## 2018-01-18 NOTE — Assessment & Plan Note (Signed)
Long discussion today about management options. Pt leery to start more medicine today as he's not seen major benefit during prior attempts. Agreeable to trying buspar and prn hydroxyzine. Counseling packet given with strong recommendation to establish with someone.

## 2018-01-18 NOTE — Assessment & Plan Note (Signed)
Currently presenting a major quality of life issue for him. Will start 50 mg seroquel to help with both sleep and moods/anxiety. Titrate up as tolerated. Sedation risks reviewed

## 2018-01-18 NOTE — Assessment & Plan Note (Signed)
Managed by Dermatology, currently stable on cosentyx.

## 2018-01-23 ENCOUNTER — Ambulatory Visit: Payer: 59 | Admitting: Internal Medicine

## 2018-02-06 ENCOUNTER — Other Ambulatory Visit: Payer: Self-pay | Admitting: Family Medicine

## 2018-02-06 NOTE — Telephone Encounter (Signed)
Requested medication (s) are due for refill today: yes  Requested medication (s) are on the active medication list: yes    Last refill: Seroquel 01/15/18    Buspar 01/15/18   Future visit scheduled yes  03/17/18  Notes to clinic:not delegated  Requested Prescriptions  Pending Prescriptions Disp Refills   QUEtiapine (SEROQUEL) 50 MG tablet [Pharmacy Med Name: QUETIAPINE FUMARATE 50 MG TAB] 90 tablet 1    Sig: TAKE 1 TABLET BY MOUTH EVERYDAY AT BEDTIME     Not Delegated - Psychiatry:  Antipsychotics - Second Generation (Atypical) - quetiapine Failed - 02/06/2018  1:37 PM      Failed - This refill cannot be delegated      Failed - ALT in normal range and within 180 days    No results found for: ALT       Failed - AST in normal range and within 180 days    No results found for: POCAST, AST       Passed - Last BP in normal range    BP Readings from Last 1 Encounters:  01/15/18 134/81         Passed - Valid encounter within last 6 months    Recent Outpatient Visits          3 weeks ago Encounter to establish care   [facility] Particia NearingLane, Rachel [place], [place]      Future Appointments            In 4 weeks Maurice MarchLane, Salley Hewsachel Elizabeth, PA-C [facility]          busPIRone (BUSPAR) 10 MG tablet [Pharmacy Med Name: BUSPIRONE HCL 10 MG TABLET] 180 tablet 1    Sig: TAKE 1 TABLET BY MOUTH TWICE A DAY     Not Delegated - Psychiatry:  Anxiolytics/Hypnotics Failed - 02/06/2018  1:37 PM      Failed - This refill cannot be delegated      Failed - Urine Drug Screen completed in last 360 days.      Passed - Valid encounter within last 6 months    Recent Outpatient Visits          3 weeks ago [facility]counter to establish care   [facility] N[facility]ane, Rachel [place], [place]      Future Appointments            In 4 weeks Particia NearingLane, Rachel Elizabeth, PA-C [facility]         Signed Prescriptions Disp Refills   hydrOXYzine  (ATARAX/VISTARIL) 25 MG tablet 270 tablet 0    Sig: TAKE 1 TABLET BY MOUTH THREE TIMES A DAY AS NEEDED     Ear, Nose, and Throat:  Antihistamines Passed - 02/06/2018  1:37 PM      Passed - Valid encounter within last 12 months    Recent Outpatient Visits          3 weeks ago Encounter to establish care   [facility] Part[facility] NearingLane, Rachel [place], [place]      Future Appointments            In 4 weeks Maurice MarchLane, Salley Hewsachel Elizabeth, PA-C [facility]

## 2018-02-07 ENCOUNTER — Other Ambulatory Visit: Payer: Self-pay | Admitting: Family Medicine

## 2018-03-06 ENCOUNTER — Encounter: Payer: Self-pay | Admitting: Family Medicine

## 2018-03-06 ENCOUNTER — Telehealth: Payer: Self-pay | Admitting: Family Medicine

## 2018-03-06 NOTE — Telephone Encounter (Signed)
Pt was due for follow up 11/20 and cancelled his appt for tomorrow. Will not refill until seen and needs to be seen sooner than January as scheduled currently

## 2018-03-06 NOTE — Telephone Encounter (Signed)
LVM and sent letter.

## 2018-03-06 NOTE — Telephone Encounter (Signed)
Called pt no answer left VM.

## 2018-03-07 ENCOUNTER — Encounter: Payer: 59 | Admitting: Family Medicine

## 2018-03-10 ENCOUNTER — Ambulatory Visit: Payer: Self-pay

## 2018-03-10 ENCOUNTER — Telehealth: Payer: Self-pay | Admitting: Family Medicine

## 2018-03-10 NOTE — Telephone Encounter (Signed)
Patient's wife called and says "my husband was put on Buspar, which I don't feel was helping him, and he was told he can't get a refill until he comes in for an appointment. He wasn't able to come in due to he works for himself and is not able to leave the job to come in for the appointment. He took his last pill Buspar last Tuesday, I believe. Every since then, he has gone down hill, in a bad mindset. He was crying this morning and says that everyone is against him, feels like he messes up everything, feels like he may as well not be here since he can't do anything." I asked where is he now, she says "he's at work now. He has to work. He is in [employer] and when the days are nice, he goes to work. Tomorrow he will be home because it's supposed to rain. Is there any way a refill can be called in for now, because I don't think that medicine is supposed to be stopped abruptly and with him not taking it for a week, he has spiraled down." I asked to hold while I call the office. I called and spoke to Austin Huynh who says the patient can be scheduled with anyone. I advised his wife says he will be off tomorrow and will be working Wednesday. She was able to open a slot for the patient for tomorrow at 0830 with Austin Maser, PA. I advised the wife and she agrees to the appointment. I advised if he is having suicidal thoughts or if he gets worse, he will need to go to the ED for evaluation, she verbalized understanding and appreciates the office staff for the appointment tomorrow.  Reason for Disposition . Patient sounds very upset or troubled to the triager  Answer Assessment - Initial Assessment Questions 1. CONCERN: "What happened that made you call today?"     Husband's crying, needs to be seen  2. ANXIETY SYMPTOM SCREENING: "Can you describe how you have been feeling?"  (e.g., tense, restless, panicky, anxious, keyed up, trouble sleeping, trouble concentrating)     He's not sleeping, crying, feelings like  everyone is against him, not in a good mindset 3. ONSET: "How long have you been feeling this way?"     Past week has gotten worse since not on Buspar-it ran out 4. RECURRENT: "Have you felt this way before?"  If yes: "What happened that time?" "What helped these feelings go away in the past?"     N/A 5. RISK OF HARM - SUICIDAL IDEATION:  "Do you ever have thoughts of hurting or killing yourself?"  (e.g., yes, no, no but preoccupation with thoughts about death)   - INTENT:  "Do you have thoughts of hurting or killing yourself right NOW?" (e.g., yes, no, N/A) N/A   - PLAN: "Do you have a specific plan for how you would do this?" (e.g., gun, knife, overdose, no plan, N/A)     N/A 6. RISK OF HARM - HOMICIDAL IDEATION:  "Do you ever have thoughts of hurting or killing someone else?"  (e.g., yes, no, no but preoccupation with thoughts about death)   - INTENT:  "Do you have thoughts of hurting or killing someone right NOW?" (e.g., yes, no, N/A) N/A   - PLAN: "Do you have a specific plan for how you would do this?" (e.g., gun, knife, no plan, N/A)      N/A 7. FUNCTIONAL IMPAIRMENT: "How have things been going for you overall  in your life? Have you had any more difficulties than usual doing your normal daily activities?"  (e.g., better, same, worse; self-care, school, work, interactions)    Going to work, but feels like everyone's against him, he messes up everything 8. SUPPORT: "Who is with you now?" "Who do you live with?" "Do you have family or friends nearby who you can talk to?"      He lives with me 9. THERAPIST: "Do you have a counselor or therapist? Name?"     Earliest appointment is January  10. STRESSORS: "Has there been any new stress or recent changes in your life?"       N/A 11. CAFFEINE ABUSE: "Do you drink caffeinated beverages, and how much each day?" (e.g., coffee, tea, colas)       N/A 12. SUBSTANCE ABUSE: "Do you use any illegal drugs or alcohol?"       N/A 13. OTHER SYMPTOMS: "Do  you have any other physical symptoms right now?" (e.g., chest pain, palpitations, difficulty breathing, fever)       N/A 14. PREGNANCY: "Is there any chance you are pregnant?" "When was your last menstrual period?"      N/A  Protocols used: ANXIETY AND PANIC ATTACK-A-AH

## 2018-03-10 NOTE — Telephone Encounter (Signed)
Copied from CRM [phone]. Topic: Quick Communication - See Telephone Encounter >> Mar 10, 2018 11:40 AM Windy KalataMichael, Lani Havlik L, NT wrote: CRM for notification. See Telephone encounter for: 03/10/18.  Patient wife is calling and states that busPIRone (BUSPAR) 10 MG tablet  is not helping the patient and she states that he is not in a good state of mind. He is crying a lot. He is also out of this medication because he could not be seen until January. Patient wife is requesting to speak with Roosvelt Maserachel Lane about this.

## 2018-03-10 NOTE — Telephone Encounter (Signed)
FYI

## 2018-03-10 NOTE — Telephone Encounter (Signed)
Has appointment with Fleet Contrasachel tomorrow- please let him know she will discuss it with him tomorrow, then back to her box for FYI. Thanks!

## 2018-03-11 ENCOUNTER — Encounter: Payer: Self-pay | Admitting: Family Medicine

## 2018-03-11 ENCOUNTER — Other Ambulatory Visit: Payer: Self-pay

## 2018-03-11 ENCOUNTER — Ambulatory Visit (INDEPENDENT_AMBULATORY_CARE_PROVIDER_SITE_OTHER): Payer: 59 | Admitting: Family Medicine

## 2018-03-11 ENCOUNTER — Telehealth: Payer: Self-pay

## 2018-03-11 VITALS — BP 139/80 | HR 81 | Temp 98.7°F | Ht 71.0 in | Wt 206.0 lb

## 2018-03-11 DIAGNOSIS — F332 Major depressive disorder, recurrent severe without psychotic features: Secondary | ICD-10-CM

## 2018-03-11 DIAGNOSIS — F329 Major depressive disorder, single episode, unspecified: Secondary | ICD-10-CM | POA: Insufficient documentation

## 2018-03-11 DIAGNOSIS — F419 Anxiety disorder, unspecified: Secondary | ICD-10-CM

## 2018-03-11 DIAGNOSIS — F5104 Psychophysiologic insomnia: Secondary | ICD-10-CM | POA: Diagnosis not present

## 2018-03-11 MED ORDER — VORTIOXETINE HBR 10 MG PO TABS: 10.0000 mg | ORAL_TABLET | Freq: Every day | ORAL | 0 refills | Status: DC

## 2018-03-11 MED ORDER — TRAZODONE HCL 50 MG PO TABS: 25.0000 mg | ORAL_TABLET | Freq: Every evening | ORAL | 0 refills | Status: DC | PRN

## 2018-03-11 NOTE — Telephone Encounter (Signed)
PA for Trintellix submitted via cover my meds. Suggested alternatives are citalopram, escitalopram, fluoxetine, duloxetine, sertraline, or venlafaxine.

## 2018-03-11 NOTE — Assessment & Plan Note (Signed)
Under poor control. No benefit on buspar or hydroxyzine, did not tolerate seroquel. Has failed many SSRIs in the past including wellbutrin, remeron, zoloft, celexa, effexor. Will trial trintellix and await Psychiatry consultation in 2 weeks.

## 2018-03-11 NOTE — Assessment & Plan Note (Signed)
Trial trazodone for sleep and mood control. Did not tolerate seroquel and has failed remeron and hydroxyzine.

## 2018-03-11 NOTE — Progress Notes (Signed)
BP 139/80   Pulse 81   Temp 98.7 F (37.1 C) (Oral)   Ht 5\' 11"  (1.803 m)   Wt 206 lb (93.4 kg)   SpO2 98%   BMI 28.73 kg/m    Subjective:    Patient ID: Austin Huynh, male    DOB: December 03, 1960, 57 y.o.   MRN: [id number]  HPI: Austin Huynh is a 57 y.o. male  Chief Complaint  Patient presents with  . Anxiety    pt wants to discuss about buspar, states he has not been taking since last Wednesday    Here today for anxiety f/u. Seroquel caused nightmares, and buspar seemed like it didn't help at all. hydroxyzine made it worse so stopped that early on. Felt jittery and swimmy headed on the medication. States he's incredibly down, feels like a burden to his family which makes him sometimes have passive thoughts of wishing he weren't around anymore. Would never harm himself or others per patient and wife who is present today. No plan or serious thought into it, just hates being a burden to others. Has not pursued counseling as recommended due to his busy schedule. Has an appointment Jan 2nd with Psychiatry for consultation.  Depression screen [facility] 2/9 03/11/2018 01/15/2018  Decreased Interest 3 3  Down, Depressed, Hopeless 3 3  PHQ - 2 Score 6 6  Altered sleeping 3 3  Tired, decreased energy 3 3  Change in appetite 3 2  Feeling bad or failure about yourself  3 3  Trouble concentrating 3 3  Moving slowly or fidgety/restless 3 3  Suicidal thoughts 2 0  PHQ-9 Score 26 23   GAD 7 : Generalized Anxiety Score 03/11/2018 01/15/2018  Nervous, Anxious, on Edge 3 3  Control/stop worrying 3 3  Worry too much - different things 3 3  Trouble relaxing 3 3  Restless 3 2  Easily annoyed or irritable 3 3  Afraid - awful might happen 3 3  Total GAD 7 Score 21 20  Anxiety Difficulty Extremely difficult Extremely difficult     Relevant past medical, surgical, family and social history reviewed and updated as indicated. Interim medical history since our last visit reviewed. Allergies and  medications reviewed and updated.  Review of Systems  Per HPI unless specifically indicated above     Objective:    BP 139/80   Pulse 81   Temp 98.7 F (37.1 C) (Oral)   Ht 5\' 11"  (1.803 m)   Wt 206 lb (93.4 kg)   SpO2 98%   BMI 28.73 kg/m   Wt Readings from Last 3 Encounters:  03/11/18 206 lb (93.4 kg)  01/15/18 198 lb (89.8 kg)  07/16/15 200 lb (90.7 kg)    Physical Exam Vitals signs and nursing note reviewed.  Constitutional:      Appearance: Normal appearance.  HENT:     Head: Atraumatic.  Eyes:     Extraocular Movements: Extraocular movements intact.     Conjunctiva/sclera: Conjunctivae normal.  Neck:     Musculoskeletal: Normal range of motion and neck supple.  Cardiovascular:     Rate and Rhythm: Normal rate and regular rhythm.  Pulmonary:     Effort: Pulmonary effort is normal.     Breath sounds: Normal breath sounds.  Musculoskeletal: Normal range of motion.  Skin:    General: Skin is warm and dry.  Neurological:     Mental Status: He is alert and oriented to person, place, and time. Mental status is at  baseline.  Psychiatric:     Comments: Tearful, flat affect    No results found for this or any previous visit.    Assessment & Plan:   Problem List Items Addressed This Visit      Other   Anxiety - Primary    Under poor control. No benefit on buspar or hydroxyzine, did not tolerate seroquel. Has failed many SSRIs in the past including wellbutrin, remeron, zoloft, celexa, effexor. Will trial trintellix and await Psychiatry consultation in 2 weeks.       Relevant Medications   vortioxetine HBr (TRINTELLIX) 10 MG TABS tablet   traZODone (DESYREL) 50 MG tablet   Insomnia    Trial trazodone for sleep and mood control. Did not tolerate seroquel and has failed remeron and hydroxyzine.       Major depression    Under poor control. No benefit on buspar or hydroxyzine, did not tolerate seroquel. Has failed many medications in the past including  wellbutrin, remeron, zoloft, celexa, effexor. Will trial trintellix and await Psychiatry consultation in 2 weeks. Trazodone added for sleep. Long discussion about counseling (pt states he does not have time) and his passive thoughts of being better off dead. Pt and wife both endorse that he is not a danger to himself but will go to ER or call mobile crisis center if worsening/becoming a serious thought. Information for mobile crisis and [facility] walk in given today. Await Psychiatry consult in 2 weeks.       Relevant Medications   vortioxetine HBr (TRINTELLIX) 10 MG TABS tablet   traZODone (DESYREL) 50 MG tablet       Follow up plan: Return in about 4 weeks (around 04/08/2018) for Depression/anxiety if not in with Psychiatry.

## 2018-03-11 NOTE — Assessment & Plan Note (Signed)
Under poor control. No benefit on buspar or hydroxyzine, did not tolerate seroquel. Has failed many medications in the past including wellbutrin, remeron, zoloft, celexa, effexor. Will trial trintellix and await Psychiatry consultation in 2 weeks. Trazodone added for sleep. Long discussion about counseling (pt states he does not have time) and his passive thoughts of being better off dead. Pt and wife both endorse that he is not a danger to himself but will go to ER or call mobile crisis center if worsening/becoming a serious thought. Information for mobile crisis and [facility] walk in given today. Await Psychiatry consult in 2 weeks.

## 2018-03-11 NOTE — Patient Instructions (Signed)
Check Trint[employer] website for savings cards

## 2018-03-12 ENCOUNTER — Ambulatory Visit: Payer: 59 | Admitting: Family Medicine

## 2018-03-12 NOTE — Telephone Encounter (Signed)
PA still pending in cover my meds.

## 2018-03-12 NOTE — Telephone Encounter (Signed)
Pt's wife calling to f/up on PA

## 2018-03-12 NOTE — Telephone Encounter (Signed)
Called patient and let him know it could take up to 72 hours and it is still pending pt ok

## 2018-03-17 ENCOUNTER — Telehealth: Payer: Self-pay

## 2018-03-17 NOTE — Telephone Encounter (Signed)
PA resubmitted with updated information for Trintellix RX. PA was approved this time. Called and left patient a VM letting him know of approval.

## 2018-04-02 ENCOUNTER — Encounter: Payer: 59 | Admitting: Family Medicine

## 2018-04-02 ENCOUNTER — Other Ambulatory Visit: Payer: Self-pay | Admitting: Family Medicine

## 2018-04-02 NOTE — Telephone Encounter (Signed)
Pharmacy is requesting changes to Rx- sent for provider review 

## 2018-04-07 ENCOUNTER — Other Ambulatory Visit: Payer: Self-pay | Admitting: Family Medicine

## 2018-04-07 ENCOUNTER — Telehealth: Payer: Self-pay | Admitting: Family Medicine

## 2018-04-07 MED ORDER — VORTIOXETINE HBR 10 MG PO TABS: 10.0000 mg | ORAL_TABLET | Freq: Every day | ORAL | 0 refills | Status: AC

## 2018-04-07 NOTE — Telephone Encounter (Signed)
Refill sent.

## 2018-04-07 NOTE — Telephone Encounter (Signed)
Copied from CRM [id number]. Topic: Quick Communication - Rx Refill/Question >> Apr 07, 2018  9:10 AM Maia Petties wrote: Medication: vortioxetine HBr (TRINTELLIX) 10 MG TABS tablet - no refills and the pharmacy stated to contact MD - wife states that PA was received in December and wants to make sure it will go thru. Pt will run out she thinks 1-2 days before OV 04/18/2018  Has the patient contacted their pharmacy? yes Preferred Pharmacy (with phone number or street name): [id number] [place], [place] - [address] [phone] (Phone) [phone] (Fax)

## 2018-04-17 ENCOUNTER — Ambulatory Visit (INDEPENDENT_AMBULATORY_CARE_PROVIDER_SITE_OTHER): Payer: 59 | Admitting: Family Medicine

## 2018-04-17 ENCOUNTER — Encounter: Payer: Self-pay | Admitting: Family Medicine

## 2018-04-17 ENCOUNTER — Other Ambulatory Visit: Payer: Self-pay

## 2018-04-17 VITALS — BP 134/80 | HR 90 | Temp 99.1°F | Ht 71.0 in | Wt 206.0 lb

## 2018-04-17 DIAGNOSIS — F332 Major depressive disorder, recurrent severe without psychotic features: Secondary | ICD-10-CM

## 2018-04-17 DIAGNOSIS — F5104 Psychophysiologic insomnia: Secondary | ICD-10-CM | POA: Diagnosis not present

## 2018-04-17 DIAGNOSIS — Z1159 Encounter for screening for other viral diseases: Secondary | ICD-10-CM

## 2018-04-17 DIAGNOSIS — Z114 Encounter for screening for human immunodeficiency virus [HIV]: Secondary | ICD-10-CM | POA: Diagnosis not present

## 2018-04-17 DIAGNOSIS — L409 Psoriasis, unspecified: Secondary | ICD-10-CM | POA: Diagnosis not present

## 2018-04-17 DIAGNOSIS — Z0000 Encounter for general adult medical examination without abnormal findings: Secondary | ICD-10-CM

## 2018-04-17 DIAGNOSIS — Z23 Encounter for immunization: Secondary | ICD-10-CM

## 2018-04-17 LAB — UA/M W/RFLX CULTURE, ROUTINE
BILIRUBIN UA: NEGATIVE
GLUCOSE, UA: NEGATIVE
Ketones, UA: NEGATIVE
Leukocytes, UA: NEGATIVE
NITRITE UA: NEGATIVE
Protein, UA: NEGATIVE
RBC, UA: NEGATIVE
Specific Gravity, UA: 1.02 (ref 1.005–1.030)
Urobilinogen, Ur: 0.2 mg/dL (ref 0.2–1.0)
pH, UA: 7 (ref 5.0–7.5)

## 2018-04-17 NOTE — Assessment & Plan Note (Signed)
Continue trazodone 25 mg QHS prn

## 2018-04-17 NOTE — Progress Notes (Signed)
BP 134/80   Pulse 90   Temp 99.1 F (37.3 C) (Oral)   Ht 5\' 11"  (1.803 m)   Wt 206 lb (93.4 kg)   SpO2 98%   BMI 28.73 kg/m    Subjective:    Patient ID: Austin Huynh, male    DOB: 05/26/1960, 58 y.o.   MRN: [id number]  HPI: Austin Huynh is a 58 y.o. male presenting on 04/17/2018 for comprehensive medical examination. Current medical complaints include:see below  Still struggling with depression and insomnia. Was supposed to establish with Psychiatry 2 weeks ago but states he missed the appt due to work and rescheduled for 2 weeks from now. Too groggy on the 50 mg trazodone so taking half which helps some but states he is still not sleeping as well as he'd like. Trintellix seems to be helping with his moods more than anything else but still not where he wants to be. Still feeling very down but no SI/HI.   Last colonoscopy was about 5 years ago.   He currently lives with: Interim Problems from his last visit: yes  *Pt declines filling out new PHQ-9 today* Depression Screen done today and results listed below:  Depression screen [facility] 2/9 03/11/2018 01/15/2018  Decreased Interest 3 3  Down, Depressed, Hopeless 3 3  PHQ - 2 Score 6 6  Altered sleeping 3 3  Tired, decreased energy 3 3  Change in appetite 3 2  Feeling bad or failure about yourself  3 3  Trouble concentrating 3 3  Moving slowly or fidgety/restless 3 3  Suicidal thoughts 2 0  PHQ-9 Score 26 23    The patient does not have a history of falls. I did not complete a risk assessment for falls. A plan of care for falls was not documented.   Past Medical History:  Past Medical History:  Diagnosis Date  . Anxiety   . Hemorrhoids   . Low back pain    Chronic radicular low back pain  . Psoriasis   . Psoriasis   . Rectal bleeding     Surgical History:  Past Surgical History:  Procedure Laterality Date  . KNEE ARTHROSCOPY Right 2005   Ligament repair  . KNEE ARTHROSCOPY Right 2008   Ligament repair  .  VASECTOMY  03/26/1994    Medications:  Current Outpatient Medications on File Prior to Visit  Medication Sig  . Secukinumab (COSENTYX SENSOREADY PEN) 150 MG/ML SOAJ Inject into the skin.  Marland Kitchen traZODone (DESYREL) 50 MG tablet TAKE 0.5-1 TABLETS (25-50 MG TOTAL) BY MOUTH AT BEDTIME AS NEEDED FOR SLEEP.  Marland Kitchen vortioxetine HBr (TRINTELLIX) 10 MG TABS tablet Take 1 tablet (10 mg total) by mouth daily.  Marland Kitchen ibuprofen (ADVIL,MOTRIN) 800 MG tablet Take 1 tablet (800 mg total) by mouth 3 (three) times daily. (Patient not taking: Reported on 04/17/2018)  . [DISCONTINUED] etanercept (ENBREL) 50 MG/ML injection Inject 50 mg into the skin once a week.   No current facility-administered medications on file prior to visit.     Allergies:  No Known Allergies  Social History:  Social History   Socioeconomic History  . Marital status: Married    Spouse name: Not on file  . Number of children: Not on file  . Years of education: Not on file  . Highest education level: Not on file  Occupational History  . Not on file  Social Needs  . Financial resource strain: Not on file  . Food insecurity:    Worry: Not  on file    Inability: Not on file  . Transportation needs:    Medical: Not on file    Non-medical: Not on file  Tobacco Use  . Smoking status: Current Every Day Smoker    Packs/day: 2.00    Types: Cigarettes  . Smokeless tobacco: Never Used  Substance and Sexual Activity  . Alcohol use: Yes    Alcohol/week: 2.0 standard drinks    Types: 2 Cans of beer per week    Comment: rarely  . Drug use: Not Currently  . Sexual activity: Yes  Lifestyle  . Physical activity:    Days per week: Not on file    Minutes per session: Not on file  . Stress: Not on file  Relationships  . Social connections:    Talks on phone: Not on file    Gets together: Not on file    Attends religious service: Not on file    Active member of club or organization: Not on file    Attends meetings of clubs or  organizations: Not on file    Relationship status: Not on file  . Intimate partner violence:    Fear of current or ex partner: Not on file    Emotionally abused: Not on file    Physically abused: Not on file    Forced sexual activity: Not on file  Other Topics Concern  . Not on file  Social History Narrative  . Not on file   Social History   Tobacco Use  Smoking Status Current Every Day Smoker  . Packs/day: 2.00  . Types: Cigarettes  Smokeless Tobacco Never Used   Social History   Substance and Sexual Activity  Alcohol Use Yes  . Alcohol/week: 2.0 standard drinks  . Types: 2 Cans of beer per week   Comment: rarely    Family History:  Family History  Problem Relation Age of Onset  . Heart failure Mother   . Cancer Father     Past medical history, surgical history, medications, allergies, family history and social history reviewed with patient today and changes made to appropriate areas of the chart.   Review of Systems - General ROS: positive for  - fatigue and sleep disturbance Psychological ROS: positive for - depression and sleep disturbances Ophthalmic ROS: negative ENT ROS: negative Allergy and Immunology ROS: negative Hematological and Lymphatic ROS: negative Endocrine ROS: negative Respiratory ROS: no cough, shortness of breath, or wheezing Cardiovascular ROS: no chest pain or dyspnea on exertion Gastrointestinal ROS: no abdominal pain, change in bowel habits, or black or bloody stools Genito-Urinary ROS: no dysuria, trouble voiding, or hematuria Musculoskeletal ROS: negative Neurological ROS: no TIA or stroke symptoms Dermatological ROS: negative All other ROS negative except what is listed above and in the HPI.      Objective:    BP 134/80   Pulse 90   Temp 99.1 F (37.3 C) (Oral)   Ht 5\' 11"  (1.803 m)   Wt 206 lb (93.4 kg)   SpO2 98%   BMI 28.73 kg/m   Wt Readings from Last 3 Encounters:  04/17/18 206 lb (93.4 kg)  03/11/18 206 lb (93.4 kg)   01/15/18 198 lb (89.8 kg)    Physical Exam Vitals signs and nursing note reviewed.  Constitutional:      General: He is not in acute distress.    Appearance: He is well-developed.  HENT:     Head: Atraumatic.     Right Ear: External ear normal.  Left Ear: External ear normal.     Nose: Nose normal.  Eyes:     General: No scleral icterus.    Conjunctiva/sclera: Conjunctivae normal.     Pupils: Pupils are equal, round, and reactive to light.  Neck:     Musculoskeletal: Normal range of motion and neck supple.  Cardiovascular:     Rate and Rhythm: Normal rate and regular rhythm.     Heart sounds: Normal heart sounds. No murmur.  Pulmonary:     Effort: Pulmonary effort is normal. No respiratory distress.     Breath sounds: Normal breath sounds.  Abdominal:     General: Bowel sounds are normal. There is no distension.     Palpations: Abdomen is soft. There is no mass.     Tenderness: There is no abdominal tenderness. There is no guarding.  Genitourinary:    Comments: Declines exam Musculoskeletal: Normal range of motion.        General: No tenderness.  Skin:    General: Skin is warm and dry.     Findings: No rash.  Neurological:     General: No focal deficit present.     Mental Status: He is alert and oriented to person, place, and time.     Deep Tendon Reflexes: Reflexes are normal and symmetric.  Psychiatric:        Behavior: Behavior normal.     Comments: Flat affect    No results found for this or any previous visit.    Assessment & Plan:   Problem List Items Addressed This Visit      Musculoskeletal and Integument   Psoriasis    Stable on cosentyx, followed by Dermatology        Other   Insomnia    Continue trazodone 25 mg QHS prn      Major depression - Primary    Improved some on trintellix, await Psychiatry evaluation in 2 week. Stressed importance of keeping this appt       Other Visit Diagnoses    Annual physical exam       Relevant Orders     CBC with Differential/Platelet   Comprehensive metabolic panel   Lipid Panel w/o Chol/HDL Ratio   UA/M w/rflx Culture, Routine   Screening for HIV (human immunodeficiency virus)       Relevant Orders   HIV Antibody (routine testing w rflx)   Need for hepatitis C screening test       Relevant Orders   Hepatitis C antibody   Need for Tdap vaccination       Relevant Orders   Tdap vaccine greater than or equal to 7yo IM (Completed)       Discussed aspirin prophylaxis for myocardial infarction prevention and decision was it was not indicated  LABORATORY TESTING:  Health maintenance labs ordered today as discussed above.   The natural history of prostate cancer and ongoing controversy regarding screening and potential treatment outcomes of prostate cancer has been discussed with the patient. The meaning of a false positive PSA and a false negative PSA has been discussed. He indicates understanding of the limitations of this screening test and wishes not to proceed with screening PSA testing.   IMMUNIZATIONS:   - Tdap: Tetanus vaccination status reviewed: Td vaccination indicated and given today. - Influenza: Refused   SCREENING: - Colonoscopy: pt reports last colonoscopy 5 years ago, will obtain records  Discussed with patient purpose of the colonoscopy is to detect colon cancer at curable precancerous or  early stages   PATIENT COUNSELING:    Sexuality: Discussed sexually transmitted diseases, partner selection, use of condoms, avoidance of unintended pregnancy  and contraceptive alternatives.   Advised to avoid cigarette smoking.  I discussed with the patient that most people either abstain from alcohol or drink within safe limits (<=14/week and <=4 drinks/occasion for males, <=7/weeks and <= 3 drinks/occasion for females) and that the risk for alcohol disorders and other health effects rises proportionally with the number of drinks per week and how often a drinker exceeds daily  limits.  Discussed cessation/primary prevention of drug use and availability of treatment for abuse.   Diet: Encouraged to adjust caloric intake to maintain  or achieve ideal body weight, to reduce intake of dietary saturated fat and total fat, to limit sodium intake by avoiding high sodium foods and not adding table salt, and to maintain adequate dietary potassium and calcium preferably from fresh fruits, vegetables, and low-fat dairy products.    stressed the importance of regular exercise  Injury prevention: Discussed safety belts, safety helmets, smoke detector, smoking near bedding or upholstery.   Dental health: Discussed importance of regular tooth brushing, flossing, and dental visits.   Follow up plan: NEXT PREVENTATIVE PHYSICAL DUE IN 1 YEAR. No follow-ups on file.

## 2018-04-17 NOTE — Assessment & Plan Note (Signed)
Stable on cosentyx, followed by Dermatology

## 2018-04-17 NOTE — Assessment & Plan Note (Signed)
Improved some on trintellix, await Psychiatry evaluation in 2 week. Stressed importance of keeping this appt

## 2018-04-18 ENCOUNTER — Encounter: Payer: Self-pay | Admitting: Family Medicine

## 2018-04-18 LAB — CBC WITH DIFFERENTIAL/PLATELET
Basophils Absolute: 0 10*3/uL (ref 0.0–0.2)
Basos: 1 %
EOS (ABSOLUTE): 0.1 10*3/uL (ref 0.0–0.4)
Eos: 1 %
Hematocrit: 44.8 % (ref 37.5–51.0)
Hemoglobin: 14.7 g/dL (ref 13.0–17.7)
Immature Grans (Abs): 0 10*3/uL (ref 0.0–0.1)
Immature Granulocytes: 0 %
Lymphocytes Absolute: 1.6 10*3/uL (ref 0.7–3.1)
Lymphs: 23 %
MCH: 30 pg (ref 26.6–33.0)
MCHC: 32.8 g/dL (ref 31.5–35.7)
MCV: 91 fL (ref 79–97)
MONOS ABS: 0.4 10*3/uL (ref 0.1–0.9)
Monocytes: 6 %
NEUTROS ABS: 4.7 10*3/uL (ref 1.4–7.0)
Neutrophils: 69 %
Platelets: 339 10*3/uL (ref 150–450)
RBC: 4.9 x10E6/uL (ref 4.14–5.80)
RDW: 13 % (ref 11.6–15.4)
WBC: 6.8 10*3/uL (ref 3.4–10.8)

## 2018-04-18 LAB — COMPREHENSIVE METABOLIC PANEL
ALT: 19 IU/L (ref 0–44)
AST: 15 IU/L (ref 0–40)
Albumin/Globulin Ratio: 1.8 (ref 1.2–2.2)
Albumin: 4.5 g/dL (ref 3.8–4.9)
Alkaline Phosphatase: 84 IU/L (ref 39–117)
BILIRUBIN TOTAL: 0.4 mg/dL (ref 0.0–1.2)
BUN/Creatinine Ratio: 13 (ref 9–20)
BUN: 15 mg/dL (ref 6–24)
CO2: 22 mmol/L (ref 20–29)
CREATININE: 1.17 mg/dL (ref 0.76–1.27)
Calcium: 9.5 mg/dL (ref 8.7–10.2)
Chloride: 102 mmol/L (ref 96–106)
GFR calc Af Amer: 81 mL/min/{1.73_m2} (ref >59)
GFR calc non Af Amer: 70 mL/min/{1.73_m2} (ref >59)
GLUCOSE: 78 mg/dL (ref 65–99)
Globulin, Total: 2.5 g/dL (ref 1.5–4.5)
Potassium: 4.9 mmol/L (ref 3.5–5.2)
SODIUM: 142 mmol/L (ref 134–144)
Total Protein: 7 g/dL (ref 6.0–8.5)

## 2018-04-18 LAB — HEPATITIS C ANTIBODY: Hep C Virus Ab: <0.1 s/co ratio (ref 0.0–0.9)

## 2018-04-18 LAB — LIPID PANEL W/O CHOL/HDL RATIO
CHOLESTEROL TOTAL: 157 mg/dL (ref 100–199)
HDL: 46 mg/dL (ref >39)
LDL Calculated: 91 mg/dL (ref 0–99)
TRIGLYCERIDES: 102 mg/dL (ref 0–149)
VLDL CHOLESTEROL CAL: 20 mg/dL (ref 5–40)

## 2018-04-18 LAB — HIV ANTIBODY (ROUTINE TESTING W REFLEX): HIV Screen 4th Generation wRfx: NONREACTIVE

## 2018-05-03 ENCOUNTER — Other Ambulatory Visit: Payer: Self-pay | Admitting: Family Medicine

## 2019-07-26 ENCOUNTER — Emergency Department
Admission: EM | Admit: 2019-07-26 | Discharge: 2019-07-26 | Disposition: A | Discharge status: Home / Self Care | Payer: 59 | Attending: Emergency Medicine | Admitting: Emergency Medicine

## 2019-07-26 ENCOUNTER — Encounter: Payer: Self-pay | Admitting: Emergency Medicine

## 2019-07-26 ENCOUNTER — Other Ambulatory Visit: Payer: Self-pay

## 2019-07-26 DIAGNOSIS — Z79899 Other long term (current) drug therapy: Secondary | ICD-10-CM | POA: Insufficient documentation

## 2019-07-26 DIAGNOSIS — F17210 Nicotine dependence, cigarettes, uncomplicated: Secondary | ICD-10-CM | POA: Diagnosis not present

## 2019-07-26 DIAGNOSIS — S61411A Laceration without foreign body of right hand, initial encounter: Secondary | ICD-10-CM | POA: Diagnosis present

## 2019-07-26 DIAGNOSIS — Y929 Unspecified place or not applicable: Secondary | ICD-10-CM | POA: Insufficient documentation

## 2019-07-26 DIAGNOSIS — Y999 Unspecified external cause status: Secondary | ICD-10-CM | POA: Insufficient documentation

## 2019-07-26 DIAGNOSIS — Y939 Activity, unspecified: Secondary | ICD-10-CM | POA: Insufficient documentation

## 2019-07-26 DIAGNOSIS — X58XXXA Exposure to other specified factors, initial encounter: Secondary | ICD-10-CM | POA: Insufficient documentation

## 2019-07-26 DIAGNOSIS — Z23 Encounter for immunization: Secondary | ICD-10-CM | POA: Insufficient documentation

## 2019-07-26 MED ORDER — LIDOCAINE HCL 1 % IJ SOLN
10.0000 mL | Freq: Once | INTRAMUSCULAR | Status: AC
  Administered 2019-07-26: 10 mL
  Filled 2019-07-26: qty 10

## 2019-07-26 MED ORDER — CEPHALEXIN 500 MG PO CAPS: 500.0000 mg | ORAL_CAPSULE | Freq: Three times a day (TID) | ORAL | 0 refills | Status: AC

## 2019-07-26 MED ORDER — TETANUS-DIPHTH-ACELL PERTUSSIS 5-2.5-18.5 LF-MCG/0.5 IM SUSP
0.5000 mL | Freq: Once | INTRAMUSCULAR | Status: AC
  Administered 2019-07-26: 0.5 mL via INTRAMUSCULAR
  Filled 2019-07-26: qty 0.5
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
## 2019-07-26 NOTE — Discharge Instructions (Signed)
Take Keflex three times daily for the next week. Have sutures removed in seven days.  
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
## 2020-04-20 ENCOUNTER — Ambulatory Visit: Payer: 59 | Admitting: Dermatology

## 2020-08-11 ENCOUNTER — Encounter: Payer: Self-pay | Admitting: Emergency Medicine

## 2020-08-11 ENCOUNTER — Ambulatory Visit
Admission: EM | Admit: 2020-08-11 | Discharge: 2020-08-11 | Disposition: A | Discharge status: Home / Self Care | Payer: 59 | Attending: Sports Medicine | Admitting: Sports Medicine

## 2020-08-11 ENCOUNTER — Other Ambulatory Visit: Payer: Self-pay

## 2020-08-11 DIAGNOSIS — H00014 Hordeolum externum left upper eyelid: Secondary | ICD-10-CM | POA: Diagnosis not present

## 2020-08-11 DIAGNOSIS — H5789 Other specified disorders of eye and adnexa: Secondary | ICD-10-CM | POA: Diagnosis not present

## 2020-08-11 DIAGNOSIS — H10503 Unspecified blepharoconjunctivitis, bilateral: Secondary | ICD-10-CM

## 2020-08-11 DIAGNOSIS — H01004 Unspecified blepharitis left upper eyelid: Secondary | ICD-10-CM | POA: Diagnosis not present

## 2020-08-11 MED ORDER — ERYTHROMYCIN 5 MG/GM OP OINT: TOPICAL_OINTMENT | OPHTHALMIC | 0 refills | Status: AC

## 2020-08-11 NOTE — ED Triage Notes (Signed)
Pt c/o bilateral eye pain, swelling, redness, and itching. He states when he wakes up his eyes are mated together. Started about 4 days ago.

## 2020-08-11 NOTE — ED Provider Notes (Signed)
[facility]    CSN: [id number] Arrival date & time: 08/11/20  1304      History   Chief Complaint Chief Complaint  Patient presents with  . Eye Problem    HPI Austin Huynh is a 60 y.o. male.   Patient is a 60 year old male who presents for evaluation of the above issue.  Normally sees [facility] here in [place] for his ongoing medical care.  They could not see him today.  He works in [employer].  He reports bilateral eye discomfort, some swelling and redness and itchiness.  He states that when he wakes up his eyes are matted shut.  It started in the right about 4 days ago and then it went to his left eye and then went to his right eye.  The right eye got better and now is back more pronounced in the left eye.  He denies any exposure to anyone with a confirmed case of conjunctivitis.  He thought he had a stye and has been using medication for it.  It did help some but has not alleviated his symptoms.  He is more concerned about the discharge.  He has noted redness in both eyes.  No sensitivity to light.  No vision changes, no painful vision, double vision, or blurry vision.  He denies any fever shakes chills.  No nausea vomiting or diarrhea.  No chest pain or shortness of breath.  No red flag signs or symptoms elicited on history.        Past Medical History:  Diagnosis Date  . Anxiety   . Hemorrhoids   . Low back pain    Chronic radicular low back pain  . Psoriasis   . Psoriasis   . Rectal bleeding     Patient Active Problem List   Diagnosis Date Noted  . Major depression 03/11/2018  . Insomnia 01/18/2018  . Anxiety 01/15/2018  . Chronic radicular low back pain 01/15/2018  . Psoriasis 01/15/2018  . Cigarette smoker 01/15/2018  . Chest pain 11/23/2011  . Hemorrhoids 11/23/2011  . Rectal bleeding 11/23/2011    Past Surgical History:  Procedure Laterality Date  . KNEE ARTHROSCOPY Right 2005   Ligament repair  . KNEE ARTHROSCOPY Right 2008    Ligament repair  . VASECTOMY  03/26/1994       Home Medications    Prior to Admission medications   Medication Sig Start Date End Date Taking? Authorizing Provider  erythromycin ophthalmic ointment Place a 1/2 inch ribbon of ointment into the lower eyelid. 08/11/20  Yes Delton See, MD  azaTHIOprine (IMURAN) 50 MG tablet Take 150 mg by mouth daily. 08/01/20   [provider]  citalopram (CELEXA) 10 MG tablet Take 15 mg by mouth daily. 06/24/20   [provider]  ibuprofen (ADVIL,MOTRIN) 800 MG tablet Take 1 tablet (800 mg total) by mouth 3 (three) times daily. Patient not taking: Reported on 04/17/2018 07/16/15   Barbaraann Barthel, NP  Secukinumab 150 MG/ML SOAJ Inject into the skin.    [provider]  traZODone (DESYREL) 50 MG tablet TAKE 0.5-1 TABLETS (25-50 MG TOTAL) BY MOUTH AT BEDTIME AS NEEDED FOR SLEEP. 04/02/18   Particia Nearing, PA-C  vortioxetine HBr (TRINTELLIX) 10 MG TABS tablet Take 1 tablet (10 mg total) by mouth daily. 04/07/18   Particia Nearing, PA-C  etanercept (ENBREL) 50 MG/ML injection Inject 50 mg into the skin once a week.  07/16/15  [provider]    Family  History Family History  Problem Relation Age of Onset  . Heart failure Mother   . Cancer Father     Social History Social History   Tobacco Use  . Smoking status: Current Every Day Smoker    Packs/day: 2.00    Types: Cigarettes  . Smokeless tobacco: Never Used  Vaping Use  . Vaping Use: Never used  Substance Use Topics  . Alcohol use: Yes    Alcohol/week: 2.0 standard drinks    Types: 2 Cans of beer per week    Comment: rarely  . Drug use: Not Currently     Allergies   Patient has no known allergies.   Review of Systems Review of Systems  Constitutional: Negative for activity change, appetite change, chills, diaphoresis, fatigue and fever.  HENT: Negative for congestion, ear pain, postnasal drip, rhinorrhea, sinus pressure, sinus pain,  sneezing and sore throat.   Eyes: Positive for discharge, redness and itching. Negative for photophobia, pain and visual disturbance.  Respiratory: Negative for cough, chest tightness and shortness of breath.   Cardiovascular: Negative for chest pain and palpitations.  Gastrointestinal: Negative for abdominal pain, diarrhea, nausea and vomiting.  Genitourinary: Negative for dysuria and hematuria.  Musculoskeletal: Negative for arthralgias, back pain, myalgias and neck pain.  Skin: Negative for color change, pallor, rash and wound.  Neurological: Negative for dizziness, seizures, syncope, light-headedness and headaches.  All other systems reviewed and are negative.    Physical Exam Triage Vital Signs ED Triage Vitals [08/11/20 1412]  Enc Vitals Group     BP 129/75     Pulse Rate 60     Resp 18     Temp 98.4 F (36.9 C)     Temp Source Oral     SpO2 99 %     Weight 210 lb 1.6 oz (95.3 kg)     Height 6' (1.829 m)     Head Circumference      Peak Flow      Pain Score 4     Pain Loc      Pain Edu?      Excl. in GC?    No data found.  Updated Vital Signs BP 129/75 (BP Location: Right Arm)   Pulse 60   Temp 98.4 F (36.9 C) (Oral)   Resp 18   Ht 6' (1.829 m)   Wt 95.3 kg   SpO2 99%   BMI 28.49 kg/m   Visual Acuity Right Eye Distance: 20/20 uncorrected Left Eye Distance: 20/25 uncorrected Bilateral Distance: 20/20 uncorrected  Right Eye Near:   Left Eye Near:    Bilateral Near:     Physical Exam Vitals and nursing note reviewed.  Constitutional:      General: He is not in acute distress.    Appearance: Normal appearance. He is not ill-appearing, toxic-appearing or diaphoretic.  HENT:     Head: Normocephalic and atraumatic.     Right Ear: Tympanic membrane normal.     Left Ear: Tympanic membrane normal.     Nose: Nose normal.     Mouth/Throat:     Mouth: Mucous membranes are moist.  Eyes:     General: No scleral icterus.       Right eye: Discharge present.  No hordeolum.        Left eye: Discharge and hordeolum present.    Extraocular Movements: Extraocular movements intact.     Right eye: Normal extraocular motion and no nystagmus.     Left eye: Normal extraocular  motion and no nystagmus.     Conjunctiva/sclera:     Right eye: Right conjunctiva is not injected.     Left eye: Left conjunctiva is injected.     Pupils: Pupils are equal, round, and reactive to light.  Cardiovascular:     Rate and Rhythm: Normal rate and regular rhythm.     Pulses: Normal pulses.     Heart sounds: Normal heart sounds. No murmur heard. No friction rub. No gallop.   Pulmonary:     Effort: Pulmonary effort is normal.     Breath sounds: Normal breath sounds. No stridor. No wheezing, rhonchi or rales.  Musculoskeletal:     Cervical back: Normal range of motion and neck supple. No rigidity or tenderness.  Lymphadenopathy:     Cervical: No cervical adenopathy.  Skin:    General: Skin is warm and dry.     Capillary Refill: Capillary refill takes less than 2 seconds.  Neurological:     General: No focal deficit present.     Mental Status: He is alert and oriented to person, place, and time.      UC Treatments / Results  Labs (all labs ordered are listed, but only abnormal results are displayed) Labs Reviewed - No data to display  EKG   Radiology No results found.  Procedures Procedures (including critical care time)  Medications Ordered in UC Medications - No data to display  Initial Impression / Assessment and Plan / UC Course  I have reviewed the triage vital signs and the nursing notes.  Pertinent labs & imaging results that were available during my care of the patient were reviewed by me and considered in my medical decision making (see chart for details).  Clinical impression: Bilateral eye discharge consistent with conjunctivitis.  He also has blepharitis of the left upper eyelid and hordeolum of the left upper eyelid.  This is causing  some irritation of the left eye but no actual pain.  Treatment plan: 1.  The findings and treatment plan were discussed in detail with the patient.  Patient was in agreement. 2.  Recommended treating him with erythromycin eye ointment.  It was sent to his pharmacy. 3.  Educational handouts provided. 4.  I did give him the number of [facility] and I want him to give them a call and be evaluated.  He voiced verbal understanding. 5.  In addition I want him to reach out to his primary care provider and update them on his situation.  He said he would do so. 6.  Supportive care, over-the-counter meds as needed.  Monitor symptoms. 7.  If symptoms worsen then he should go to the ER. 8.  He was stable on discharge and he will follow-up here as please see educational handouts. Needed.    Final Clinical Impressions(s) / UC Diagnoses   Final diagnoses:  Hordeolum externum of left upper eyelid  Irritation of left eye  Blepharitis of left upper eyelid, unspecified type  Blepharoconjunctivitis of both eyes, unspecified blepharoconjunctivitis type   Discharge Instructions   None    ED Prescriptions    Medication Sig Dispense Auth. Provider   erythromycin ophthalmic ointment Place a 1/2 inch ribbon of ointment into the lower eyelid. 3.5 g Delton See, MD     PDMP not reviewed this encounter.   Delton See, MD 08/12/20 2252

## 2020-08-11 NOTE — Discharge Instructions (Addendum)
As we discussed, I sent in medication for the discharge from your eyes.  Please use that as directed. Please contact [facility] and be evaluated in their office. Please contact your primary care provider and update them on your situation. Over-the-counter meds as needed. Please see educational handouts. If symptoms worsen please go to the ER.
